# Patient Record
Sex: Female | Born: 1970 | Race: Black or African American | Hispanic: No | Marital: Married | Smoking: Never smoker
Health system: Southern US, Community
[De-identification: ages and names within clinical notes are randomized; demographics above are authoritative.]

## PROBLEM LIST (undated history)

## (undated) ENCOUNTER — Inpatient Hospital Stay (HOSPITAL_COMMUNITY): Payer: Medicaid Other

## (undated) DIAGNOSIS — F419 Anxiety disorder, unspecified: Secondary | ICD-10-CM

## (undated) HISTORY — DX: Anxiety disorder, unspecified: F41.9

---

## 1997-11-01 ENCOUNTER — Emergency Department (HOSPITAL_COMMUNITY): Admission: EM | Admit: 1997-11-01 | Discharge: 1997-11-01 | Payer: Self-pay | Admitting: Emergency Medicine

## 1997-11-19 ENCOUNTER — Ambulatory Visit (HOSPITAL_COMMUNITY): Admission: RE | Admit: 1997-11-19 | Discharge: 1997-11-19 | Payer: Self-pay | Admitting: Obstetrics & Gynecology

## 1997-12-30 ENCOUNTER — Inpatient Hospital Stay (HOSPITAL_COMMUNITY): Admission: AD | Admit: 1997-12-30 | Discharge: 1998-01-01 | Payer: Self-pay | Admitting: *Deleted

## 2002-09-03 ENCOUNTER — Ambulatory Visit (HOSPITAL_COMMUNITY): Admission: RE | Admit: 2002-09-03 | Discharge: 2002-09-03 | Payer: Self-pay | Admitting: Obstetrics & Gynecology

## 2002-09-03 ENCOUNTER — Encounter: Payer: Self-pay | Admitting: Obstetrics & Gynecology

## 2003-01-18 ENCOUNTER — Encounter (INDEPENDENT_AMBULATORY_CARE_PROVIDER_SITE_OTHER): Payer: Self-pay | Admitting: Specialist

## 2003-01-18 ENCOUNTER — Inpatient Hospital Stay (HOSPITAL_COMMUNITY): Admission: AD | Admit: 2003-01-18 | Discharge: 2003-01-20 | Payer: Self-pay | Admitting: Obstetrics & Gynecology

## 2006-08-12 ENCOUNTER — Inpatient Hospital Stay (HOSPITAL_COMMUNITY): Admission: AD | Admit: 2006-08-12 | Discharge: 2006-08-12 | Payer: Self-pay | Admitting: Obstetrics

## 2006-10-03 ENCOUNTER — Ambulatory Visit (HOSPITAL_COMMUNITY): Admission: RE | Admit: 2006-10-03 | Discharge: 2006-10-03 | Payer: Self-pay | Admitting: Obstetrics

## 2007-02-19 ENCOUNTER — Inpatient Hospital Stay (HOSPITAL_COMMUNITY): Admission: AD | Admit: 2007-02-19 | Discharge: 2007-02-22 | Payer: Self-pay | Admitting: Obstetrics and Gynecology

## 2007-11-27 ENCOUNTER — Ambulatory Visit: Payer: Self-pay | Admitting: Internal Medicine

## 2007-12-01 ENCOUNTER — Ambulatory Visit: Payer: Self-pay | Admitting: *Deleted

## 2007-12-09 ENCOUNTER — Ambulatory Visit: Payer: Self-pay | Admitting: Internal Medicine

## 2008-01-19 ENCOUNTER — Ambulatory Visit: Payer: Self-pay | Admitting: Internal Medicine

## 2008-01-19 ENCOUNTER — Encounter: Payer: Self-pay | Admitting: Family Medicine

## 2008-01-19 LAB — CONVERTED CEMR LAB
ALT: 14 units/L (ref 0–35)
AST: 16 units/L (ref 0–37)
Basophils Absolute: 0 10*3/uL (ref 0.0–0.1)
Basophils Relative: 0 % (ref 0–1)
CO2: 18 meq/L — ABNORMAL LOW (ref 19–32)
Calcium: 9.3 mg/dL (ref 8.4–10.5)
Chloride: 108 meq/L (ref 96–112)
Cholesterol: 133 mg/dL (ref 0–200)
Creatinine, Ser: 0.7 mg/dL (ref 0.40–1.20)
Lymphocytes Relative: 39 % (ref 12–46)
MCHC: 32.6 g/dL (ref 30.0–36.0)
Neutro Abs: 2.5 10*3/uL (ref 1.7–7.7)
Platelets: 280 10*3/uL (ref 150–400)
RDW: 13.7 % (ref 11.5–15.5)
Sodium: 140 meq/L (ref 135–145)
TSH: 1.056 microintl units/mL (ref 0.350–4.50)
Total Bilirubin: 0.5 mg/dL (ref 0.3–1.2)
Total CHOL/HDL Ratio: 3.6
Total Protein: 7.7 g/dL (ref 6.0–8.3)
VLDL: 18 mg/dL (ref 0–40)

## 2008-01-30 ENCOUNTER — Encounter: Payer: Self-pay | Admitting: Family Medicine

## 2008-01-30 ENCOUNTER — Ambulatory Visit: Payer: Self-pay | Admitting: Internal Medicine

## 2008-01-30 LAB — CONVERTED CEMR LAB
Chlamydia, DNA Probe: NEGATIVE
GC Probe Amp, Genital: NEGATIVE

## 2008-03-01 ENCOUNTER — Ambulatory Visit: Payer: Self-pay | Admitting: Internal Medicine

## 2008-05-24 ENCOUNTER — Ambulatory Visit: Payer: Self-pay | Admitting: Internal Medicine

## 2008-06-23 ENCOUNTER — Ambulatory Visit: Payer: Self-pay | Admitting: Internal Medicine

## 2008-08-16 ENCOUNTER — Ambulatory Visit: Payer: Self-pay | Admitting: Internal Medicine

## 2008-09-13 IMAGING — US US OB COMP +14 WK
1 series · 14 of 28 positions shown · non-contrast
Comparison: none

OBSTETRICAL ULTRASOUND:

 This ultrasound exam was performed in the [HOSPITAL] Ultrasound Department.  The OB US report was generated in the AS system, and faxed to the ordering physician.  This report is also available in [REDACTED] PACS.

[Series 1: us ob comp +14 wk · 0.24mm/px · 14 of 85 slices shown]
[im 4/85]
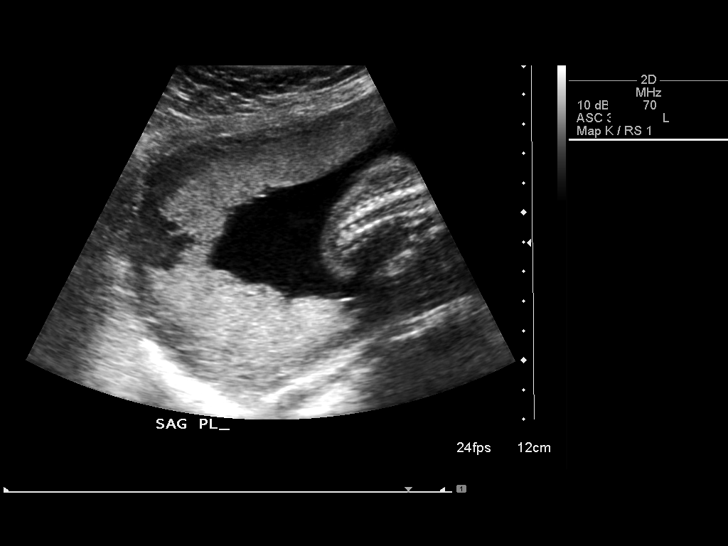
[im 10/85]
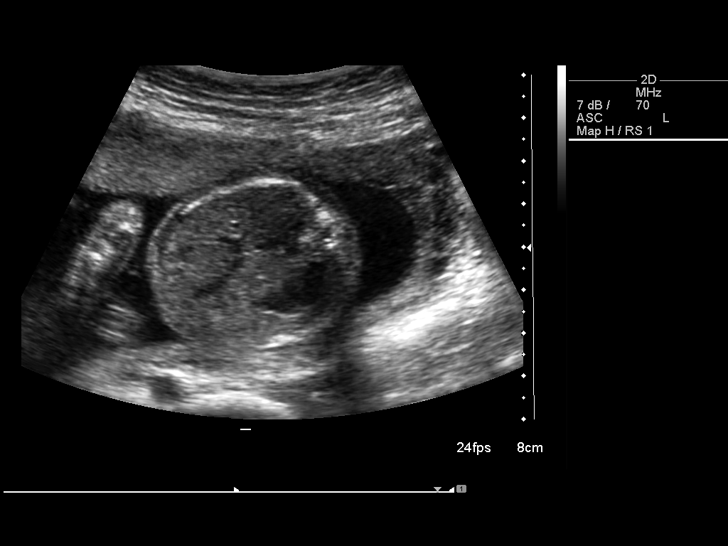
[im 16/85]
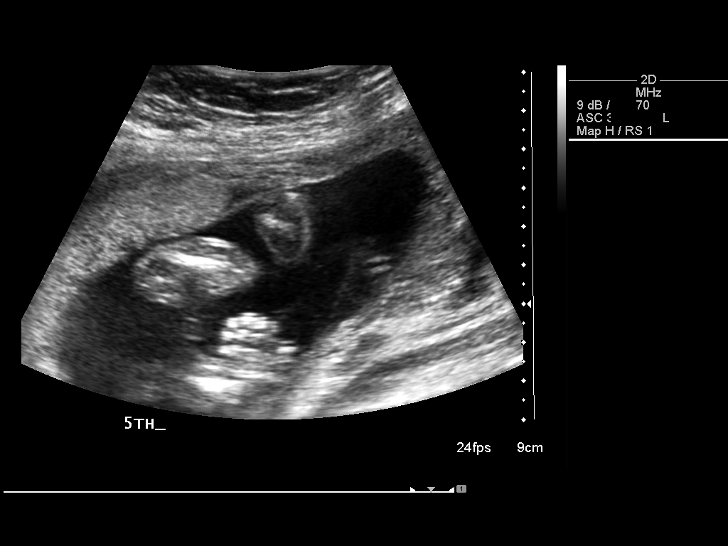
[im 22/85]
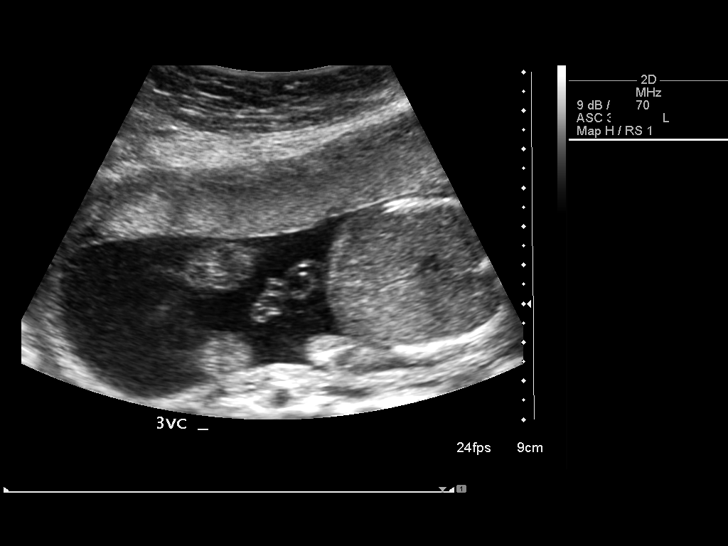
[im 29/85]
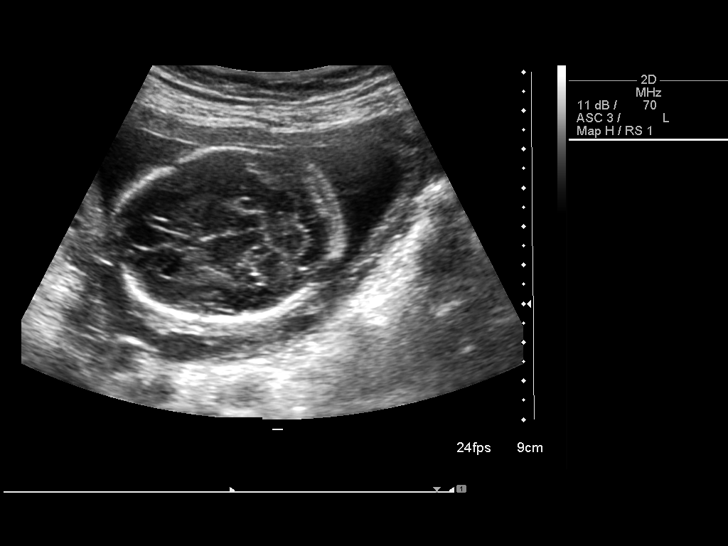
[im 35/85]
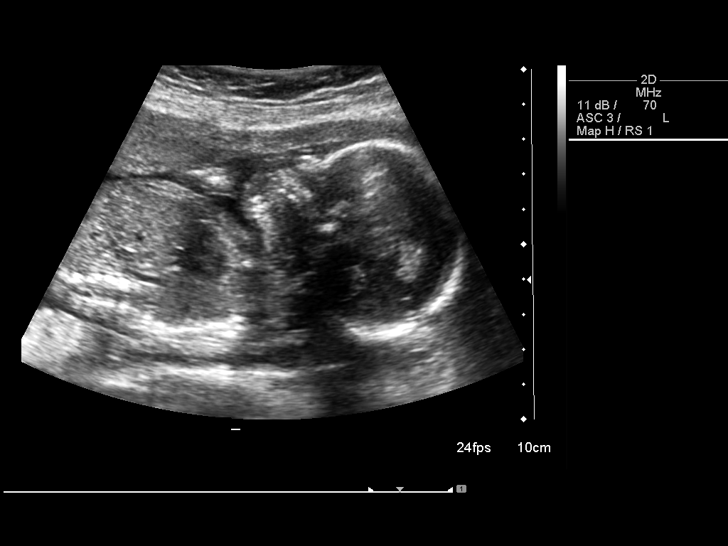
[im 41/85]
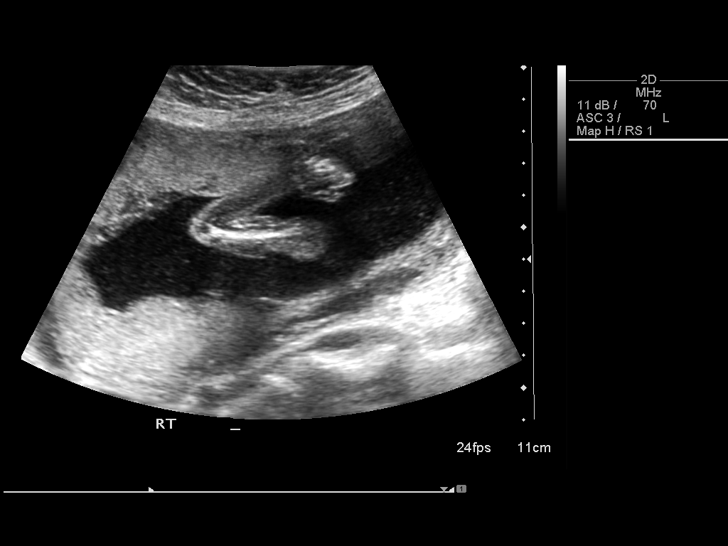
[im 47/85]
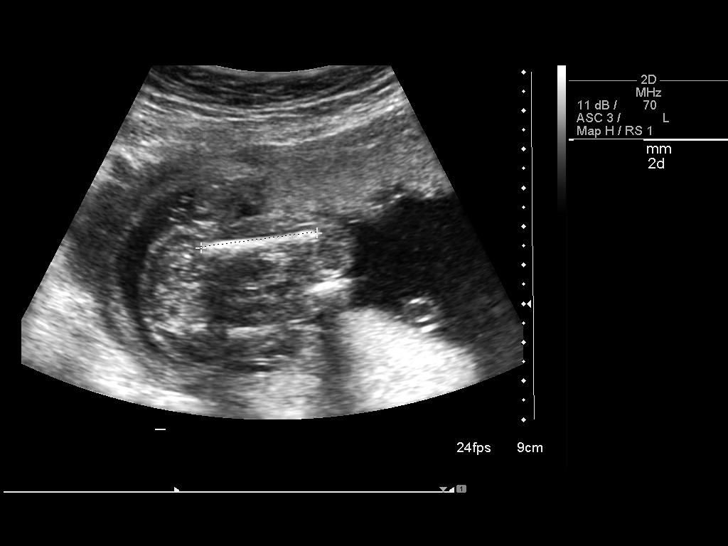
[im 53/85]
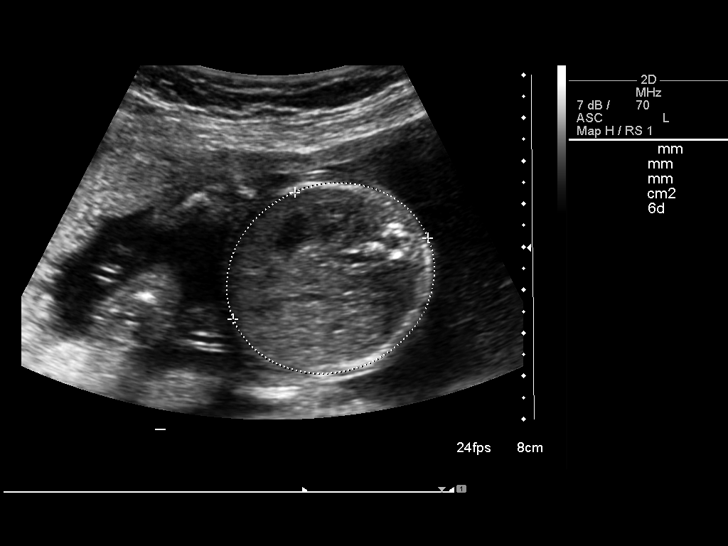
[im 60/85]
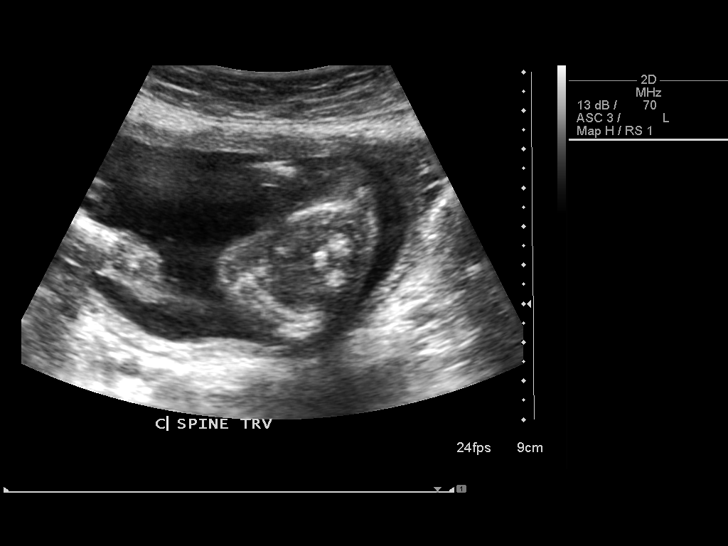
[im 66/85]
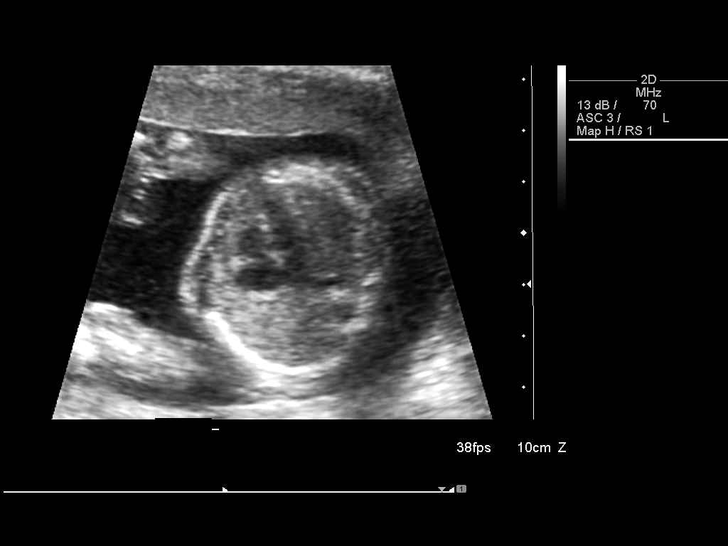
[im 72/85]
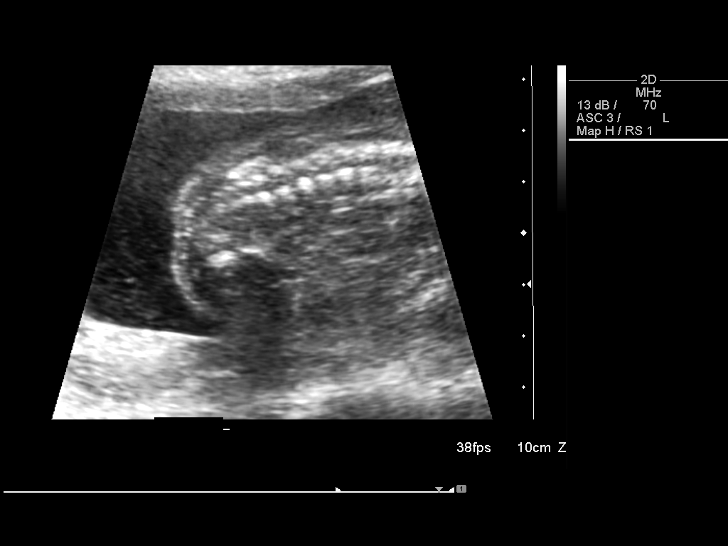
[im 78/85]
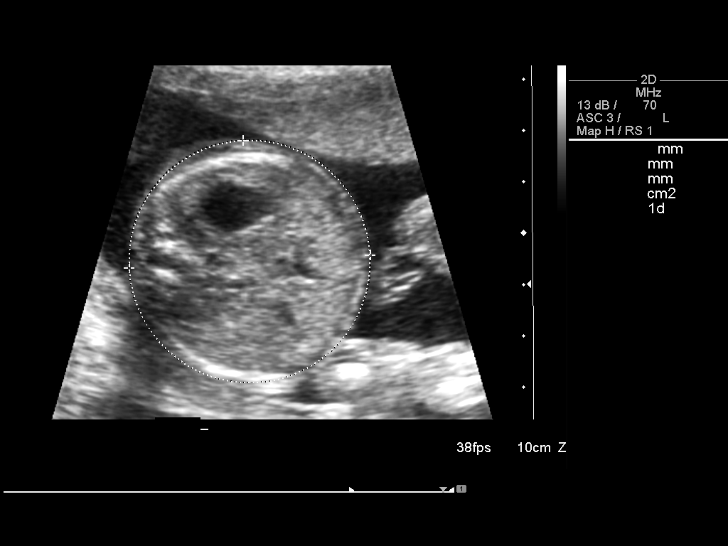
[im 85/85]
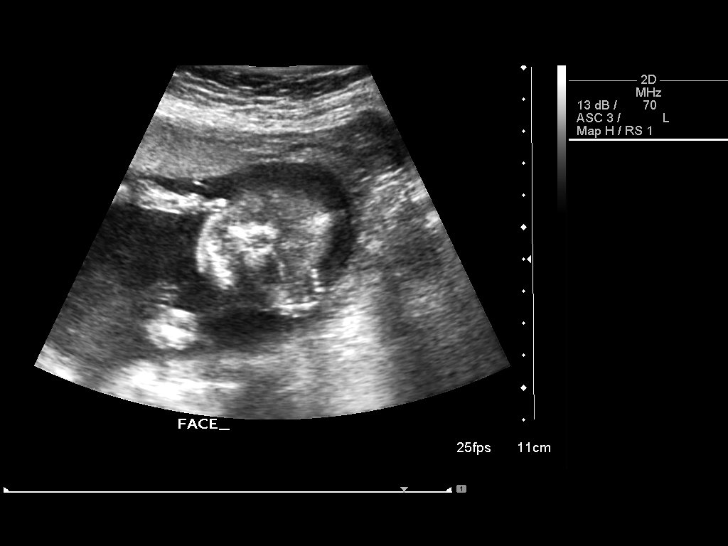

[14 of 28 positions shown; findings below may reference images not displayed]

IMPRESSION: See AS Obstetric US report.

## 2008-11-05 ENCOUNTER — Ambulatory Visit: Payer: Self-pay | Admitting: Internal Medicine

## 2009-01-27 ENCOUNTER — Ambulatory Visit: Payer: Self-pay | Admitting: Internal Medicine

## 2009-03-03 ENCOUNTER — Ambulatory Visit: Payer: Self-pay | Admitting: Family Medicine

## 2009-04-08 ENCOUNTER — Encounter: Payer: Self-pay | Admitting: Family Medicine

## 2009-04-08 ENCOUNTER — Ambulatory Visit: Payer: Self-pay | Admitting: Internal Medicine

## 2009-05-31 ENCOUNTER — Ambulatory Visit: Payer: Self-pay | Admitting: Internal Medicine

## 2010-07-13 ENCOUNTER — Encounter (INDEPENDENT_AMBULATORY_CARE_PROVIDER_SITE_OTHER): Payer: Self-pay | Admitting: Physician Assistant

## 2010-07-13 ENCOUNTER — Other Ambulatory Visit: Payer: Self-pay | Admitting: Physician Assistant

## 2010-07-13 DIAGNOSIS — Z01419 Encounter for gynecological examination (general) (routine) without abnormal findings: Secondary | ICD-10-CM

## 2010-07-13 DIAGNOSIS — Z124 Encounter for screening for malignant neoplasm of cervix: Secondary | ICD-10-CM

## 2010-08-25 NOTE — Progress Notes (Signed)
[name], [name]                ACCOUNT NO.:  [id number]  MEDICAL RECORD NO.:  [id number]           PATIENT TYPE:  A  LOCATION:  [facility]                   FACILITY:  [facility]  PHYSICIAN:  Maylon Cos, CNM    DATE OF BIRTH:  01/12/1971  DATE OF SERVICE:  07/13/2010                                 CLINIC NOTE  REASON FOR VISIT:  Annual exam and Pap smear.  HISTORY OF PRESENT ILLNESS:  The patient presents with no complaints.  ALLERGIES:  She has no known drug allergies.  MEDICATIONS:  She is not currently taking any medications.  PRIMARY CARE PROVIDER:  [facility].  Her last physical exam was in August 2011.  MENSTRUAL HISTORY:  First day of her last menstrual period was June 02, 2011.  She has regular periods.  They last from 3-5 days and are roughly 28-30 days cycles.  She has a medium flow without pain and no bleeding in between her periods.  CONTRACEPTION:  She is currently using condoms.  She is not desiring pregnancy in the future.  OBSTETRICAL HISTORY:  She is a gravida 7, para 7-0-0-7 with 1 cesarean section for transverse lie and 6 spontaneous vaginal deliveries without complications.  GYNECOLOGIC HISTORY:  Her last Pap smear was performed in 2010.  She has no history of abnormal Pap smears.  STD HISTORY:  Negative.  PERSONAL MEDICAL HISTORY:  Negative.  FAMILY HISTORY:  Positive for hypertension in her mother and diabetes in her maternal grandmother.  SURGICAL HISTORY:  Positive for cesarean section for transverse lie in 2004.  She has no history of blood transfusions.  SOCIAL HISTORY:  She lives with her husband and 6 children who range in age 51 to age 14.  She is not currently working outside the home.  She denies tobacco use, alcohol use, drug use, physical or sexual abuse. She has had one sexual partner in the last year.  REVIEW OF SYSTEMS:  Systemic review was negative x12.  PHYSICAL EXAMINATION:  VITAL SIGNS:  Stable.  Pulse is 66,  blood pressure is 114/70, her weight today is 132.9, and height is 67 inches. Exam today is GYN focused. HEENT:  Grossly normal. NECK:  No thyromegaly. BREASTS:  Symmetrical.  No dimpling or retraction of the skin.  Nipples are erect without discharge.  No masses or nontender. ABDOMEN:  Nontender.  No hepatosplenomegaly. GENITALIA:  She is Tanner 5.  Mucous membranes are pink without lesions. She has irregular rugae with moderate tone.  Cervix is parous and nonfriable.  Bimanual exam reveals a nontender nonenlarged uterus, nontender and nonenlarged adnexa. EXTREMITIES:  Equal strength x4.  Lower extremities are without edema and pedal pulses are equal.  ASSESSMENT: 1. Well-woman. 2. Undesired fertility.  PLAN: 1. Pap smear was obtained and sent per routine to Pathology. 2. The patient is currently encouraged to use spermicide along with     her condom use for contraception to increase pregnancy protection.     The patient is also encouraged to get daily multivitamin with folic     acid. 3. Health maintenance.  The patient is encouraged to exercise at least  30 minutes daily.  The patient should return annually for physical     exams given her past medical history of no abnormals in her age.     She will be due for her next Pap smear in 2-3 years given that this     was normal and should follow up with [facility] as needed     for primary care.          ______________________________ Maylon Cos, CNM    SS/MEDQ  D:  07/13/2010  T:  07/14/2010  Job:  [id number]

## 2010-10-17 NOTE — H&P (Signed)
[name], [name]            ACCOUNT NO.:  [id number]   MEDICAL RECORD NO.:  [id number]          PATIENT TYPE:  INP   LOCATION:  9174                          [facility]   PHYSICIAN:  Roseanna Rainbow, M.D.DATE OF BIRTH:  [birth year]   DATE OF ADMISSION:  02/19/2007  DATE OF DISCHARGE:                              HISTORY & PHYSICAL   CHIEF COMPLAINT:  The patient is a 40 year old para 6 with estimated  date of confinement of February 27, 2007 with an intrauterine pregnancy  at 58 and 6/7 weeks for an elective induction of labor.   HISTORY OF PRESENT ILLNESS:  Please see the above.   ALLERGIES:  NO KNOWN DRUG ALLERGIES.   MEDICATIONS:  Please see the medication reconciliation form.   OB RISK FACTORS:  1. Grand multiparity.  2. History of previous cesarean section.  3. Documented low uterine flap elliptical.  4. Urinary tract infection.   PRENATAL SCREENING:  Quad screen negative.  Platelets 191,000,  hemoglobin 12.4, hematocrit 35.8, urine culture and sensitivity on September 05, 2006, enterococcus.  GBS positive on January 21, 2007.  Three hour GTT  normal.  HIV nonreactive.  Sickle-cell negative.  Blood Type is B  positive, antibody screen negative.  RPR nonreactive.  GC probe  negative.  PAP smear negative.  Varicella immune.   PAST GYNECOLOGICAL HISTORY:  Noncontributory.   PAST MEDICAL HISTORY:  No significant history of medical disease.   PAST SURGICAL HISTORY:  Please seen the above.   SOCIAL HISTORY:  She is a Psychiatric nurse.  She is married, living with  her spouse.  She does not give any significant history of alcohol usage.  She has no significant smoking history.  She denies illicit drug use.   FAMILY HISTORY:  Hypertension   PAST OBSTETRICAL HISTORY:  In 1992, she was delivered of a liveborn  female, 7 pounds, vaginal delivery, no complications.  In 1995 she was delivered of a liveborn female, 7 pounds, full term,  vaginal delivery, no  complications.  In 1997, she was delivered of a liveborn female, 7 pounds, vaginal  delivery, no complications.  In 1998, she was delivered of a liveborn female, 7 pounds, vaginal  delivery, no complications.  In 1999, she was delivered of a liveborn female, 7 pounds 14 ounces,  vaginal delivery, no complications.  In 2004, she was delivered of a liveborn female, full term via cesarean  delivery secondary to arrest of descent.   PHYSICAL EXAMINATION:  VITAL SIGNS:  Stable, afebrile. Fetal heart  tracing reassuring.  TOCODYNAMOMETRY: Rare uterine contractions.  STERILE VAGINAL EXAM:  The cervix is 2 cm dilated, 60% effaced with a  vertex at a minus 2 station.   ASSESSMENT:  1. Grand multipara at term.  2. History of a previous cesarean delivery.  Desires vaginal birth      after cesarean section.  3. Fetal heart tracing consistent with fetal well-being.  Borderline      favorable Bishop score.  4. Group B Streptococcus positive.   PLAN:  1. Admission.  2. Low dose pitocin per protocol.  3. Penicillin group B  Streptococcus prophylaxis per protocol.      Roseanna Rainbow, M.D.  Electronically Signed     LAJ/MEDQ  D:  02/19/2007  T:  02/19/2007  Job:  [id number]

## 2010-10-20 NOTE — Op Note (Signed)
[name], [name]                        ACCOUNT NO.:  [id number]   MEDICAL RECORD NO.:  [id number]                   PATIENT TYPE:  INP   LOCATION:  9141                                 [facility]   PHYSICIAN:  Roseanna Rainbow, M.D.         DATE OF BIRTH:  1970/12/26   DATE OF PROCEDURE:  01/18/2003  DATE OF DISCHARGE:                                 OPERATIVE REPORT   PREOPERATIVE DIAGNOSIS:  Suspicious fetal heart tracing with repetitive  prolonged severe decelerations, remote from delivery.   POSTOPERATIVE DIAGNOSIS:  Suspicious fetal heart tracing with repetitive  prolonged severe decelerations, remote from delivery.   PROCEDURE:  Primary low uterine flap elliptical cesarean delivery via  Pfannenstiel skin incision.   SURGEON:  Roseanna Rainbow, M.D.   ASSISTANT:  Naima A. Normand Sloop, M.D.   ANESTHESIA:  Spinal anesthesia.   ESTIMATED BLOOD LOSS:  800 mL.   COMPLICATIONS:  None.   DESCRIPTION OF PROCEDURE:  The patient was taken to the operating room.  A  spinal anesthetic was administered without difficulty.  She was then placed  in the dorsal supine position and prepped and draped quickly.  A  Pfannenstiel skin incision was then made with the scalpel and carried down  to the underlying fascia.  The fascia was incised with the scalpel.  The  rectus muscles were bluntly separated in the midline.  The parietal  peritoneum was then entered bluntly.  The bladder blade was placed.  The  vesicouterine peritoneum was tented up and entered sharply with Metzenbaum  scissors.  The bladder flap was created digitally.  The bladder blade was  then replaced.  The lower uterine segment was incised in transverse fashion  with the scalpel.  This incision was then extended bluntly.  A live born  female was then delivered vertex presentation. The infant was suctioned with  the bulb suction.  The cord was clamped and cut.  Cord gases were sent.  The  placenta was then  removed.  The uterus was cleared of amniotic fluid, clots  and debris with a moistened laparotomy sponge.  The uterine incision was  then reapproximated in a running interlocking fashion using 0 Monocryl.  A  second layer of the same suture was used to obtain excellent hemostasis.  The paracolic gutters were then irrigated.  The parietal peritoneum was  reapproximated using 2-0 Vicryl.  The fascia was reapproximated in a running  fashion with 0 Vicryl.  The skin was reapproximated in a subcuticular  fashion using 3-0 Monocryl.  There was 1 g of cefazolin given at cord clamp.  At the close of the procedure, the instrument and pack counts were said to  be correct x2.  The patient was taken to the PACU awake and in stable  condition.  Roseanna Rainbow, M.D.   Judee Clara  D:  01/18/2003  T:  01/18/2003  Job:  [id number]

## 2010-10-20 NOTE — Discharge Summary (Signed)
[name], [name]                        ACCOUNT NO.:  [id number]   MEDICAL RECORD NO.:  [id number]                   PATIENT TYPE:  INP   LOCATION:  9141                                 [facility]   PHYSICIAN:  Roseanna Rainbow, M.D.         DATE OF BIRTH:  [birth year]   DATE OF ADMISSION:  01/18/2003  DATE OF DISCHARGE:  01/20/2003                                 DISCHARGE SUMMARY   CHIEF COMPLAINT:  The patient is a 40 year old gravida 6, para 5, with an  estimated date of confinement January 21, 2003, with an intrauterine  pregnancy at 39 weeks, complaining of contractions.   HISTORY OF PRESENT ILLNESS:  As above.   ANTEPARTUM PROBLEMS OR RISKS:  1. Grand multiparity.  2. GBS positive.   LABORATORY WORK:  Hemoglobin and hematocrit 11.8 and 34.8.  Blood type B+.  Antibody screen negative.  Sickle cell trait negative.  RPR nonreactive.  Hepatitis B surface antigen negative.  HIV nonreactive.  Pap smear within  normal limits.  GC and Chlamydia negative.   PAST OBGYN HISTORY:  She is status post five NSVDs.   PAST MEDICAL HISTORY:  She denies.   PAST SURGICAL HISTORY:  She denies.   FAMILY HISTORY:  She denies.   SOCIAL HISTORY:  Married.  No tobacco, ethanol, or substance abuse.   ALLERGIES:  No known drug allergies.   MEDICATIONS:  Prenatal vitamins.   PHYSICAL EXAMINATION:  VITAL SIGNS:  Blood pressure 123/69, pulse 73,  respirations 18, temperature 98.7.  Fetal heart rate 130s with mild variable  decelerations.  Uterine contractions every 5 minutes and comfortable.  VAGINAL:  Sterile vaginal exam per the registered nurse, 5 centimeters  dilated, 70% effaced with the vertex at a -2 station.   ASSESSMENTPLAN:  1. Grand multipara at term with late latent versus early active labor.  2. GBS positive.  3. Fetal heart tracing reassuring.  4. Admit.  5. Expectant management.  6. Penicillin GBS prophylaxis.   HOSPITAL COURSE:  The patient was admitted.   The fetal heart tracing  developed repetitive prolonged decelerations nadir in the 60s.  The  patient's cervical exam was without change.  She was brought to the  operating room for urgent cesarean delivery.  Please see the dictated  operative summary for further details.  The remainder of her hospital course  was uneventful.  She was discharged to home on postoperative day number two,  tolerating a regular diet.   DISCHARGE DIAGNOSES:  1. Term intrauterine pregnancy.  2. Suspicious fetal heart tracing with prolonged repetitive severe     decelerations.   PROCEDURE:  Cesarean delivery.   CONDITION ON DISCHARGE:  Good.   DIET:  Regular.   ACTIVITY:  As per the instruction booklet.   MEDICATIONS:  1. Percocet.  2. Ibuprofen.  3. Prenatal vitamins.  4. Ferrous sulfate.   DISPOSITION:  The patient was to follow up in the office in one  week.                                               Roseanna Rainbow, M.D.    LAJ/MEDQ  D:  02/24/2003  T:  02/24/2003  Job:  [id number]

## 2011-03-15 LAB — CBC
HCT: 34.3 — ABNORMAL LOW
HCT: 37.4
MCHC: 34.6
MCHC: 35.4
MCV: 86.7
Platelets: 168
RBC: 4.31
RDW: 14

## 2011-03-15 LAB — RPR: RPR Ser Ql: NONREACTIVE

## 2012-06-04 NOTE — L&D Delivery Note (Signed)
Delivery Note At 12:58 PM a viable female was delivered via  (Presentation: ;  ).  APGAR: , ; weight .   Placenta status: , .  Cord:  with the following complications: .  Cord pH: not done  Anesthesia:   Episiotomy:  Lacerations:  Suture Repair: 2.0 Est. Blood Loss (mL):   Mom to postpartum.  Baby to Couplet care / Skin to Skin.  MARSHALL,BERNARD A 05/08/2013, 1:07 PM

## 2012-09-23 ENCOUNTER — Encounter: Payer: Self-pay | Admitting: Obstetrics & Gynecology

## 2012-09-23 ENCOUNTER — Encounter: Payer: Self-pay | Admitting: *Deleted

## 2012-09-23 ENCOUNTER — Other Ambulatory Visit (INDEPENDENT_AMBULATORY_CARE_PROVIDER_SITE_OTHER): Payer: Medicaid Other

## 2012-09-23 ENCOUNTER — Other Ambulatory Visit: Payer: Self-pay | Admitting: Obstetrics & Gynecology

## 2012-09-23 DIAGNOSIS — Z3200 Encounter for pregnancy test, result unknown: Secondary | ICD-10-CM

## 2012-09-23 DIAGNOSIS — Z3201 Encounter for pregnancy test, result positive: Secondary | ICD-10-CM

## 2012-09-23 NOTE — Progress Notes (Signed)
Pt informed of + urine pregnancy test. She stated that she wants "to have a miscarriage" and "I am not ready right now".  I informed pt that we do not perform abortions at this hospital. I advised her to contact [facility] or consult the phone book, internet, etc for places that perform abortions in [place]. Pt voiced understanding and was given letter of confirmation of positive pregnancy test.

## 2012-10-17 ENCOUNTER — Encounter (INDEPENDENT_AMBULATORY_CARE_PROVIDER_SITE_OTHER): Payer: Medicaid Other | Admitting: Obstetrics & Gynecology

## 2012-10-17 ENCOUNTER — Encounter: Payer: Self-pay | Admitting: Obstetrics & Gynecology

## 2012-10-17 ENCOUNTER — Ambulatory Visit (INDEPENDENT_AMBULATORY_CARE_PROVIDER_SITE_OTHER): Payer: Medicaid Other | Admitting: Obstetrics & Gynecology

## 2012-10-17 VITALS — BP 96/63 | Temp 98.2°F | Wt 136.2 lb

## 2012-10-17 DIAGNOSIS — O09521 Supervision of elderly multigravida, first trimester: Secondary | ICD-10-CM

## 2012-10-17 DIAGNOSIS — O09529 Supervision of elderly multigravida, unspecified trimester: Secondary | ICD-10-CM | POA: Insufficient documentation

## 2012-10-17 DIAGNOSIS — Z3480 Encounter for supervision of other normal pregnancy, unspecified trimester: Secondary | ICD-10-CM

## 2012-10-17 DIAGNOSIS — Z3481 Encounter for supervision of other normal pregnancy, first trimester: Secondary | ICD-10-CM

## 2012-10-17 LAB — POCT URINALYSIS DIPSTICK
Bilirubin, UA: NEGATIVE
Blood, UA: 250
Glucose, UA: NEGATIVE
Leukocytes, UA: NEGATIVE
Nitrite, UA: NEGATIVE
Urobilinogen, UA: NEGATIVE
pH, UA: 7

## 2012-10-17 LAB — POCT URINE PREGNANCY: Preg Test, Ur: POSITIVE

## 2012-10-17 LAB — HIV ANTIBODY (ROUTINE TESTING W REFLEX): HIV: NONREACTIVE

## 2012-10-17 MED ORDER — TERCONAZOLE 0.4 % VA CREA: 1.0000 | TOPICAL_CREAM | Freq: Every day | VAGINAL | Status: DC

## 2012-10-17 MED ORDER — NEXA PLUS 29-1.25-350 MG PO CAPS: 1.0000 | ORAL_CAPSULE | Freq: Every morning | ORAL | Status: DC

## 2012-10-17 NOTE — Progress Notes (Signed)
Pulse- 69  . Subjective:    [name] is being seen today for her first obstetrical visit.  This is not a planned pregnancy. She is at [redacted]w[redacted]d gestation. Her obstetrical history is significant for advanced maternal age. Relationship with FOB: spouse, living together. Patient does intend to breast feed. Pregnancy history fully reviewed.  Menstrual History: OB History   Grav Para Term Preterm Abortions TAB SAB Ect Mult Living   8 7 7       7       Menarche age: 35 Patient's last menstrual period was 08/11/2012.    The following portions of the patient's history were reviewed and updated as appropriate: allergies, current medications, past family history, past medical history, past social history, past surgical history and problem list.  Review of Systems Pertinent items are noted in HPI.    Objective:   General Appearance:    Alert, cooperative, no distress, appears stated age  Head:    Normocephalic, without obvious abnormality, atraumatic  Eyes:    PERRL, conjunctiva/corneas clear, EOM's intact, fundi    benign, both eyes  Ears:    Normal TM's and external ear canals, both ears  Nose:   Nares normal, septum midline, mucosa normal, no drainage    or sinus tenderness  Throat:   Lips, mucosa, and tongue normal; teeth and gums normal  Neck:   Supple, symmetrical, trachea midline, no adenopathy;    thyroid:  no enlargement/tenderness/nodules; no carotid   bruit or JVD  Back:     Symmetric, no curvature, ROM normal, no CVA tenderness  Lungs:     Clear to auscultation bilaterally, respirations unlabored  Chest Wall:    No tenderness or deformity   Heart:    Regular rate and rhythm, S1 and S2 normal, no murmur, rub   or gallop  Breast Exam:    No tenderness, masses, or nipple abnormality  Abdomen:     Soft, non-tender, bowel sounds active all four quadrants,    no masses, no organomegaly  Genitalia:    Erythema, white discharge   Extremities:   Extremities normal, atraumatic, no  cyanosis or edema  Pulses:   2+ and symmetric all extremities  Skin:   Skin color, texture, turgor normal, no rashes or lesions  Lymph nodes:   Cervical, supraclavicular, and axillary nodes normal  Neurologic:   CNII-XII intact, normal strength, sensation and reflexes    throughout     Assessment:    Pregnancy at [redacted]w[redacted]d weeks    Plan:    Initial labs drawn. Prenatal vitamins. Problem list reviewed and updated. Amniocentesis discussed: declined. Referral for genetics counseling for possible NIPT Follow up in 4 weeks. 50% of 20 min visit spent on counseling and coordination of care.

## 2012-10-17 NOTE — Progress Notes (Signed)
CRL by me 11+ w/cardiac activity present.

## 2012-10-17 NOTE — Patient Instructions (Signed)

## 2012-10-17 NOTE — Patient Instructions (Signed)
Amniocentesis Amniocentesis (amnio) is the removal of a small amount of fluid that surrounds the baby in the amniotic sac. Once the fluid is removed, it may be examined to find answers to a number of serious questions. An amnio is often done early in pregnancy (between 15 and 17 weeks) to determine if there is a complication with the baby, or it is done later in pregnancy (between 6633 and 37 weeks) to see if the baby's lungs are mature. Amnios that are done later in pregnancy are often done to help weigh the risks and benefits of a needed early delivery. Amnios done early in the second trimester are commonly referred to as genetic amnios, as they are typically done to check for a potential life-altering genetic abnormality. Rarely, an amnio is done to evaluate the amniotic fluid for concerns of infection. Amniocentesis may be done for other reasons, including:   If the mother is 40 years old or older. This is because of the increased risk of chromosome abnormalities, such as Down's syndrome or various chromosomal trisomies.  To determine any genetic problems.  To look for signs of infection in the uterus.  To determine if the baby's lungs are mature enough for the baby to survive outside of the uterus. This information is important in pregnancies when the baby must be delivered early. LET YOUR CAREGIVER KNOW ABOUT:  Any complications you have had with the pregnancy, such as bleeding or contractions.  Allergies.  Medicines taken, including vitamins, herbs, eyedrops, over-the-counter medicines, and creams.  Use of steroids (by mouth or creams).  Previous problems with numbing medicines.  History of bleeding problems or blood clots.  Previous surgery.  Other health problems. RISKS AND COMPLICATIONS  Complications can include:  Vaginal bleeding.  Transmission of an infection from mother to baby, such as hepatitis C or other viruses.  Leaking of amniotic fluid.  Premature  labor.  Fetal injury.  Injury to the placenta.  Miscarriage (rare). This procedure is done only if your caregiver feels the information obtained from the procedure justifies the risk. Amnios should not be performed before 15 weeks of pregnancy unless it is absolutely necessary. BEFORE THE PROCEDURE   Ask your caregiver any questions you may have.  Eat as usual.  Drink enough fluids to keep your urine clear or pale yellow.  You may want to arrange for someone to drive you home after the procedure. PROCEDURE  A careful ultrasound is done to evaluate the baby for its position and for where the best pockets of fluid lie. The mother's abdomen is prepped with a solution to prevent infections. Often, a sterile ultrasound probe is used to watch the location of the amniocentesis needle being used. A small spot of the skin may be injected with a numbing medicine. In that location, a longer needle is introduced through the skin and down to the level of the baby. Amniotic fluid is removed into a syringe and sent to the lab for evaluation. AFTER THE PROCEDURE   Ask your caregiver if you need a RhoGam shot.  You will rest for 1 to 2 hours for observation.  Your baby will be placed on a monitor for 1 to 2 hours to see if there are any problems.  You may develop cramping and a small amount of vaginal bleeding right after the amnio.  Ask when your test results will be ready. Make sure you get your test results. Document Released: 05/18/2000 Document Revised: 08/13/2011 Document Reviewed: 03/26/2011 Exit[employer] Patient Information  [address], [place].

## 2012-10-18 LAB — WET PREP BY MOLECULAR PROBE
Gardnerella vaginalis: NEGATIVE
Trichomonas vaginosis: NEGATIVE

## 2012-10-18 LAB — VITAMIN D 25 HYDROXY (VIT D DEFICIENCY, FRACTURES): Vit D, 25-Hydroxy: <10 ng/mL — ABNORMAL LOW (ref 30–89)

## 2012-10-20 ENCOUNTER — Ambulatory Visit: Payer: Medicaid Other | Admitting: Obstetrics & Gynecology

## 2012-10-20 LAB — OBSTETRIC PANEL
Antibody Screen: NEGATIVE
Basophils Absolute: 0 10*3/uL (ref 0.0–0.1)
Basophils Relative: 0 % (ref 0–1)
Eosinophils Absolute: 0.4 10*3/uL (ref 0.0–0.7)
Eosinophils Relative: 6 % — ABNORMAL HIGH (ref 0–5)
HCT: 38.3 % (ref 36.0–46.0)
Lymphocytes Relative: 25 % (ref 12–46)
MCH: 28.3 pg (ref 26.0–34.0)
MCHC: 34.5 g/dL (ref 30.0–36.0)
MCV: 82.2 fL (ref 78.0–100.0)
Monocytes Absolute: 0.6 10*3/uL (ref 0.1–1.0)
Platelets: 257 10*3/uL (ref 150–400)
RDW: 14.9 % (ref 11.5–15.5)
WBC: 6.7 10*3/uL (ref 4.0–10.5)

## 2012-10-20 LAB — PAP IG, CT-NG NAA, HPV HIGH-RISK: GC Probe Amp: NEGATIVE

## 2012-10-20 LAB — CULTURE, OB URINE

## 2012-10-21 ENCOUNTER — Encounter: Payer: Self-pay | Admitting: Obstetrics & Gynecology

## 2012-10-21 DIAGNOSIS — R8271 Bacteriuria: Secondary | ICD-10-CM | POA: Insufficient documentation

## 2012-10-21 DIAGNOSIS — O9989 Other specified diseases and conditions complicating pregnancy, childbirth and the puerperium: Secondary | ICD-10-CM

## 2012-10-21 LAB — HEMOGLOBINOPATHY EVALUATION
Hgb A2 Quant: 2.7 % (ref 2.2–3.2)
Hgb A: 97.3 % (ref 96.8–97.8)
Hgb F Quant: 0 % (ref 0.0–2.0)
Hgb S Quant: 0 %

## 2012-10-22 ENCOUNTER — Encounter: Payer: Self-pay | Admitting: Obstetrics & Gynecology

## 2012-10-22 DIAGNOSIS — E559 Vitamin D deficiency, unspecified: Secondary | ICD-10-CM | POA: Insufficient documentation

## 2012-10-28 ENCOUNTER — Other Ambulatory Visit: Payer: Self-pay | Admitting: Obstetrics & Gynecology

## 2012-10-28 DIAGNOSIS — O9989 Other specified diseases and conditions complicating pregnancy, childbirth and the puerperium: Secondary | ICD-10-CM

## 2012-10-29 ENCOUNTER — Encounter: Payer: Self-pay | Admitting: Obstetrics

## 2012-10-29 ENCOUNTER — Encounter: Payer: Self-pay | Admitting: *Deleted

## 2012-10-29 ENCOUNTER — Ambulatory Visit (INDEPENDENT_AMBULATORY_CARE_PROVIDER_SITE_OTHER): Payer: Medicaid Other

## 2012-10-29 DIAGNOSIS — Z3480 Encounter for supervision of other normal pregnancy, unspecified trimester: Secondary | ICD-10-CM

## 2012-10-29 DIAGNOSIS — O9989 Other specified diseases and conditions complicating pregnancy, childbirth and the puerperium: Secondary | ICD-10-CM

## 2012-10-29 LAB — US OB COMP LESS 14 WKS

## 2012-10-29 LAB — US OB DETAIL + 14 WK

## 2012-10-30 ENCOUNTER — Encounter: Payer: Self-pay | Admitting: Obstetrics & Gynecology

## 2012-11-04 ENCOUNTER — Encounter: Payer: Self-pay | Admitting: Obstetrics & Gynecology

## 2012-11-05 ENCOUNTER — Ambulatory Visit (INDEPENDENT_AMBULATORY_CARE_PROVIDER_SITE_OTHER): Payer: Medicaid Other | Admitting: Obstetrics & Gynecology

## 2012-11-05 ENCOUNTER — Encounter: Payer: Self-pay | Admitting: Obstetrics & Gynecology

## 2012-11-05 VITALS — BP 111/74 | Temp 98.2°F | Ht 64.0 in | Wt 138.8 lb

## 2012-11-05 DIAGNOSIS — H1013 Acute atopic conjunctivitis, bilateral: Secondary | ICD-10-CM

## 2012-11-05 DIAGNOSIS — Z3400 Encounter for supervision of normal first pregnancy, unspecified trimester: Secondary | ICD-10-CM

## 2012-11-05 DIAGNOSIS — Z3402 Encounter for supervision of normal first pregnancy, second trimester: Secondary | ICD-10-CM

## 2012-11-05 DIAGNOSIS — H1010 Acute atopic conjunctivitis, unspecified eye: Secondary | ICD-10-CM

## 2012-11-05 DIAGNOSIS — J309 Allergic rhinitis, unspecified: Secondary | ICD-10-CM | POA: Insufficient documentation

## 2012-11-05 LAB — POCT URINALYSIS DIPSTICK
Bilirubin, UA: NEGATIVE
Glucose, UA: NEGATIVE
Ketones, UA: NEGATIVE
Spec Grav, UA: 1.02
pH, UA: 6.5

## 2012-11-05 MED ORDER — OLOPATADINE HCL 0.1 % OP SOLN: 1.0000 [drp] | Freq: Two times a day (BID) | OPHTHALMIC | Status: DC

## 2012-11-05 NOTE — Progress Notes (Signed)
Referral for genetics counseling--considering possible NIPT.  Seasonal allergies--conjunctivitis.

## 2012-11-05 NOTE — Patient Instructions (Addendum)

## 2012-11-05 NOTE — Progress Notes (Signed)
Pulse: 69

## 2012-11-11 ENCOUNTER — Institutional Professional Consult (permissible substitution): Payer: Medicaid Other

## 2012-11-27 ENCOUNTER — Encounter: Payer: Self-pay | Admitting: Obstetrics & Gynecology

## 2012-11-27 ENCOUNTER — Other Ambulatory Visit: Payer: Self-pay | Admitting: *Deleted

## 2012-11-27 DIAGNOSIS — O0990 Supervision of high risk pregnancy, unspecified, unspecified trimester: Secondary | ICD-10-CM

## 2012-12-03 ENCOUNTER — Ambulatory Visit (INDEPENDENT_AMBULATORY_CARE_PROVIDER_SITE_OTHER): Payer: Medicaid Other

## 2012-12-03 ENCOUNTER — Encounter: Payer: Self-pay | Admitting: Obstetrics & Gynecology

## 2012-12-03 ENCOUNTER — Ambulatory Visit (INDEPENDENT_AMBULATORY_CARE_PROVIDER_SITE_OTHER): Payer: Medicaid Other | Admitting: Obstetrics & Gynecology

## 2012-12-03 VITALS — BP 102/65 | Temp 97.9°F | Wt 140.0 lb

## 2012-12-03 DIAGNOSIS — Z3482 Encounter for supervision of other normal pregnancy, second trimester: Secondary | ICD-10-CM

## 2012-12-03 DIAGNOSIS — Z3480 Encounter for supervision of other normal pregnancy, unspecified trimester: Secondary | ICD-10-CM

## 2012-12-03 DIAGNOSIS — O0990 Supervision of high risk pregnancy, unspecified, unspecified trimester: Secondary | ICD-10-CM

## 2012-12-03 LAB — POCT URINALYSIS DIPSTICK
Bilirubin, UA: NEGATIVE
Glucose, UA: NEGATIVE
Ketones, UA: NEGATIVE
Spec Grav, UA: 1.025
Urobilinogen, UA: NEGATIVE

## 2012-12-03 NOTE — Progress Notes (Signed)
Doing well 

## 2012-12-03 NOTE — Patient Instructions (Addendum)
Pregnancy - Second Trimester The second trimester is the period between 13 to 27 weeks of your pregnancy. It is important to follow your doctor's instructions. HOME CARE   Do not smoke.  Do not drink alcohol or use drugs.  Only take medicine as told by your doctor.  Take prenatal vitamins as told. The vitamin should contain 1 milligram of folic acid.  Exercise.  Eat healthy foods. Eat regular, well-balanced meals.  You can have sex (intercourse) if there are no other problems with the pregnancy.  Do not use hot tubs, steam rooms, or saunas.  Wear a seat belt while driving.  Avoid raw meat, uncooked cheese, and litter boxes and soil used by cats.  Visit your dentist. Cleanings are okay. GET HELP RIGHT AWAY IF:   You have a temperature by mouth above 102 F (38.9 C), not controlled by medicine.  Fluid is coming from your vagina.  Blood is coming from your vagina. Light spotting is common, especially after sex (intercourse).  You have a bad smelling fluid (discharge) coming from the vagina. The fluid changes from clear to white.  You still feel sick to your stomach (nauseous).  You throw up (vomit) blood.  You lose or gain more than 2 pounds (0.9 kilograms) of weight in a week, or as suggested by your doctor.  Your face, hands, feet, or legs get puffy (swell).  You get exposed to German measles and have never had them.  You get exposed to fifth disease or chickenpox.  You have belly (abdominal) pain.  You have a bad headache that will not go away.  You have watery poop (diarrhea), pain when you pee (urinate), or have shortness of breath.  You start to have problems seeing (blurry or double vision).  You fall, are in a car accident, or have any kind of trauma.  There is mental or physical violence at home.  You have any concerns or worries during your pregnancy. MAKE SURE YOU:   Understand these instructions.  Will watch your condition.  Will get help  right away if you are not doing well or get worse. Document Released: 08/15/2009 Document Revised: 08/13/2011 Document Reviewed: 08/15/2009 Exit[employer] Patient Information 2014 [employer].  

## 2012-12-03 NOTE — Progress Notes (Signed)
Pulse-68  No complaints.

## 2012-12-08 ENCOUNTER — Encounter: Payer: Self-pay | Admitting: Obstetrics

## 2012-12-08 LAB — US OB DETAIL + 14 WK

## 2012-12-11 ENCOUNTER — Encounter: Payer: Self-pay | Admitting: Obstetrics & Gynecology

## 2012-12-25 ENCOUNTER — Ambulatory Visit (INDEPENDENT_AMBULATORY_CARE_PROVIDER_SITE_OTHER): Payer: Medicaid Other | Admitting: Obstetrics & Gynecology

## 2012-12-25 VITALS — BP 98/62 | Temp 98.1°F | Wt 138.8 lb

## 2012-12-25 DIAGNOSIS — Z3480 Encounter for supervision of other normal pregnancy, unspecified trimester: Secondary | ICD-10-CM

## 2012-12-25 DIAGNOSIS — Z3482 Encounter for supervision of other normal pregnancy, second trimester: Secondary | ICD-10-CM

## 2012-12-25 LAB — POCT URINALYSIS DIPSTICK
Blood, UA: NEGATIVE
Glucose, UA: NEGATIVE
Spec Grav, UA: 1.015
Urobilinogen, UA: NEGATIVE
pH, UA: 7

## 2012-12-25 NOTE — Progress Notes (Signed)
Pulse: 67

## 2012-12-25 NOTE — Addendum Note (Signed)
Addended by: Antionette Char on: 12/25/2012 03:29 PM   Modules accepted: Orders

## 2012-12-25 NOTE — Patient Instructions (Signed)

## 2012-12-25 NOTE — Progress Notes (Signed)
Pt is doing well.

## 2012-12-28 LAB — CULTURE, OB URINE

## 2012-12-30 ENCOUNTER — Encounter: Payer: Self-pay | Admitting: Obstetrics & Gynecology

## 2012-12-30 DIAGNOSIS — Z22330 Carrier of Group B streptococcus: Secondary | ICD-10-CM | POA: Insufficient documentation

## 2012-12-31 ENCOUNTER — Encounter: Payer: Medicaid Other | Admitting: Obstetrics & Gynecology

## 2012-12-31 ENCOUNTER — Other Ambulatory Visit: Payer: Self-pay | Admitting: Obstetrics & Gynecology

## 2012-12-31 DIAGNOSIS — Z1389 Encounter for screening for other disorder: Secondary | ICD-10-CM

## 2013-01-07 ENCOUNTER — Telehealth: Payer: Self-pay | Admitting: *Deleted

## 2013-01-07 MED ORDER — SULFAMETHOXAZOLE-TMP DS 800-160 MG PO TABS: 1.0000 | ORAL_TABLET | Freq: Two times a day (BID) | ORAL | Status: DC

## 2013-01-07 NOTE — Telephone Encounter (Signed)
Message copied by Glendell Docker on Wed Jan 07, 2013  5:46 PM ------      Message from: Antionette Char      Created: Tue Dec 30, 2012  7:47 AM         Call in rx for Bactrim DS 1 tab po bid x 7d      ----- Message -----         From: [facility]         Sent: 12/28/2012   2:51 PM           To: Antionette Char, MD                   ------

## 2013-01-07 NOTE — Telephone Encounter (Signed)
Call placed to patient at [phone], no answer. A voice message was left for patient to return call regarding lab results. Rx for Bactrim DS 1 tablet po bid x 7 sent to patient pharmacy on file.

## 2013-01-13 ENCOUNTER — Ambulatory Visit (INDEPENDENT_AMBULATORY_CARE_PROVIDER_SITE_OTHER): Payer: Medicaid Other

## 2013-01-13 ENCOUNTER — Other Ambulatory Visit: Payer: Self-pay | Admitting: Obstetrics & Gynecology

## 2013-01-13 ENCOUNTER — Other Ambulatory Visit: Payer: Self-pay | Admitting: *Deleted

## 2013-01-13 ENCOUNTER — Encounter: Payer: Self-pay | Admitting: Obstetrics & Gynecology

## 2013-01-13 DIAGNOSIS — O365921 Maternal care for other known or suspected poor fetal growth, second trimester, fetus 1: Secondary | ICD-10-CM

## 2013-01-13 DIAGNOSIS — O09522 Supervision of elderly multigravida, second trimester: Secondary | ICD-10-CM

## 2013-01-13 DIAGNOSIS — O09529 Supervision of elderly multigravida, unspecified trimester: Secondary | ICD-10-CM

## 2013-01-13 DIAGNOSIS — N390 Urinary tract infection, site not specified: Secondary | ICD-10-CM

## 2013-01-13 DIAGNOSIS — O365990 Maternal care for other known or suspected poor fetal growth, unspecified trimester, not applicable or unspecified: Secondary | ICD-10-CM

## 2013-01-13 DIAGNOSIS — Z3400 Encounter for supervision of normal first pregnancy, unspecified trimester: Secondary | ICD-10-CM

## 2013-01-13 DIAGNOSIS — Z1389 Encounter for screening for other disorder: Secondary | ICD-10-CM

## 2013-01-13 LAB — US OB DETAIL + 14 WK

## 2013-01-13 MED ORDER — SULFAMETHOXAZOLE-TMP DS 800-160 MG PO TABS: 1.0000 | ORAL_TABLET | Freq: Two times a day (BID) | ORAL | Status: DC

## 2013-01-14 ENCOUNTER — Encounter: Payer: Medicaid Other | Admitting: Obstetrics & Gynecology

## 2013-01-19 ENCOUNTER — Encounter: Payer: Self-pay | Admitting: Obstetrics & Gynecology

## 2013-01-19 LAB — US OB DETAIL + 14 WK

## 2013-01-21 NOTE — Telephone Encounter (Signed)
No return contact form patient. Patient has appointment on file 8/27/214 with Dr. Tamela Oddi.

## 2013-01-28 ENCOUNTER — Other Ambulatory Visit: Payer: Medicaid Other

## 2013-01-28 ENCOUNTER — Encounter: Payer: Medicaid Other | Admitting: Obstetrics & Gynecology

## 2013-01-30 ENCOUNTER — Ambulatory Visit (INDEPENDENT_AMBULATORY_CARE_PROVIDER_SITE_OTHER): Payer: Medicaid Other | Admitting: Obstetrics & Gynecology

## 2013-01-30 ENCOUNTER — Other Ambulatory Visit: Payer: Medicaid Other

## 2013-01-30 ENCOUNTER — Encounter: Payer: Self-pay | Admitting: Obstetrics & Gynecology

## 2013-01-30 VITALS — BP 101/70 | Temp 98.1°F | Wt 145.0 lb

## 2013-01-30 DIAGNOSIS — Z3480 Encounter for supervision of other normal pregnancy, unspecified trimester: Secondary | ICD-10-CM

## 2013-01-30 DIAGNOSIS — Z23 Encounter for immunization: Secondary | ICD-10-CM

## 2013-01-30 DIAGNOSIS — O0942 Supervision of pregnancy with grand multiparity, second trimester: Secondary | ICD-10-CM

## 2013-01-30 DIAGNOSIS — O0940 Supervision of pregnancy with grand multiparity, unspecified trimester: Secondary | ICD-10-CM | POA: Insufficient documentation

## 2013-01-30 NOTE — Addendum Note (Signed)
Addended by: George Hugh on: 01/30/2013 11:27 AM   Modules accepted: Orders

## 2013-01-30 NOTE — Progress Notes (Signed)
P 67 Patient reports she has some back pain at times and she feels pressure at times, but nothing out of the ordinary.

## 2013-01-30 NOTE — Patient Instructions (Signed)
Etonogestrel implant What is this medicine? ETONOGESTREL is a contraceptive (birth control) device. It is used to prevent pregnancy. It can be used for up to 3 years. This medicine may be used for other purposes; ask your health care provider or pharmacist if you have questions. What should I tell my health care provider before I take this medicine? They need to know if you have any of these conditions: -abnormal vaginal bleeding -blood vessel disease or blood clots -cancer of the breast, cervix, or liver -depression -diabetes -gallbladder disease -headaches -heart disease or recent heart attack -high blood pressure -high cholesterol -kidney disease -liver disease -renal disease -seizures -tobacco smoker -an unusual or allergic reaction to etonogestrel, other hormones, anesthetics or antiseptics, medicines, foods, dyes, or preservatives -pregnant or trying to get pregnant -breast-feeding How should I use this medicine? This device is inserted just under the skin on the inner side of your upper arm by a health care professional. Talk to your pediatrician regarding the use of this medicine in children. Special care may be needed. Overdosage: If you think you've taken too much of this medicine contact a poison control center or emergency room at once. Overdosage: If you think you have taken too much of this medicine contact a poison control center or emergency room at once. NOTE: This medicine is only for you. Do not share this medicine with others. What if I miss a dose? This does not apply. What may interact with this medicine? Do not take this medicine with any of the following medications: -amprenavir -bosentan -fosamprenavir This medicine may also interact with the following medications: -barbiturate medicines for inducing sleep or treating seizures -certain medicines for fungal infections like ketoconazole and itraconazole -griseofulvin -medicines to treat seizures like  carbamazepine, felbamate, oxcarbazepine, phenytoin, topiramate -modafinil -phenylbutazone -rifampin -some medicines to treat HIV infection like atazanavir, indinavir, lopinavir, nelfinavir, tipranavir, ritonavir -St. John's wort This list may not describe all possible interactions. Give your health care provider a list of all the medicines, herbs, non-prescription drugs, or dietary supplements you use. Also tell them if you smoke, drink alcohol, or use illegal drugs. Some items may interact with your medicine. What should I watch for while using this medicine? This product does not protect you against HIV infection (AIDS) or other sexually transmitted diseases. You should be able to feel the implant by pressing your fingertips over the skin where it was inserted. Tell your doctor if you cannot feel the implant. What side effects may I notice from receiving this medicine? Side effects that you should report to your doctor or health care professional as soon as possible: -allergic reactions like skin rash, itching or hives, swelling of the face, lips, or tongue -breast lumps -changes in vision -confusion, trouble speaking or understanding -dark urine -depressed mood -general ill feeling or flu-like symptoms -light-colored stools -loss of appetite, nausea -right upper belly pain -severe headaches -severe pain, swelling, or tenderness in the abdomen -shortness of breath, chest pain, swelling in a leg -signs of pregnancy -sudden numbness or weakness of the face, arm or leg -trouble walking, dizziness, loss of balance or coordination -unusual vaginal bleeding, discharge -unusually weak or tired -yellowing of the eyes or skin Side effects that usually do not require medical attention (Report these to your doctor or health care professional if they continue or are bothersome.): -acne -breast pain -changes in weight -cough -fever or chills -headache -irregular menstrual  bleeding -itching, burning, and vaginal discharge -pain or difficulty passing urine -sore throat This   list may not describe all possible side effects. Call your doctor for medical advice about side effects. You may report side effects to FDA at 1-800-FDA-1088. Where should I keep my medicine? This drug is given in a hospital or clinic and will not be stored at home. NOTE: This sheet is a summary. It may not cover all possible information. If you have questions about this medicine, talk to your doctor, pharmacist, or health care provider.  2013, [employer]/Gold Standard. (02/11/2009 3:54:17 PM) Glucose Tolerance Test This is a test to see how your body processes carbohydrates. This test is often done to check patients for diabetes or the possibility of developing it. PREPARATION FOR TEST You should have nothing to eat or drink 12 hours before the test. You will be given a form of sugar (glucose) and then blood samples will be drawn from your vein to determine the level of sugar in your blood. Alternatively, blood may be drawn from your finger for testing. You should not smoke or exercise during the test. NORMAL FINDINGS  Fasting: 70-115 mg/dL  30 minutes: less than 200 mg/dL  1 hour: less than 161 mg/dL  2 hours: less than 096 mg/dL  3 hours: 04-540 mg/dL  4 hours: 98-119 mg/dL Ranges for normal findings may vary among different laboratories and hospitals. You should always check with your doctor after having lab work or other tests done to discuss the meaning of your test results and whether your values are considered within normal limits. MEANING OF TEST Your caregiver will go over the test results with you and discuss the importance and meaning of your results, as well as treatment options and the need for additional tests. OBTAINING THE TEST RESULTS It is your responsibility to obtain your test results. Ask the lab or department performing the test when and how you will get your  results. Document Released: 06/13/2004 Document Revised: 08/13/2011 Document Reviewed: 05/01/2008 [facility] Patient Information 2014 [place], [place]. Tetanus, Diphtheria, Pertussis (Tdap) Vaccine What You Need to Know WHY GET VACCINATED? Tetanus, diphtheria and pertussis can be very serious diseases, even for adolescents and adults. Tdap vaccine can protect [place] from these diseases. TETANUS (Lockjaw) causes painful muscle tightening and stiffness, usually all over the body.  It can lead to tightening of muscles in the head and neck so you can't open your mouth, swallow, or sometimes even breathe. Tetanus kills about 1 out of 5 people who are infected. DIPHTHERIA can cause a thick coating to form in the back of the throat.  It can lead to breathing problems, paralysis, heart failure, and death. PERTUSSIS (Whooping Cough) causes severe coughing spells, which can cause difficulty breathing, vomiting and disturbed sleep.  It can also lead to weight loss, incontinence, and rib fractures. Up to 2 in 100 adolescents and 5 in 100 adults with pertussis are hospitalized or have complications, which could include pneumonia and death. These diseases are caused by bacteria. Diphtheria and pertussis are spread from person to person through coughing or sneezing. Tetanus enters the body through cuts, scratches, or wounds. Before vaccines, the [place] saw as many as 200,000 cases a year of diphtheria and pertussis, and hundreds of cases of tetanus. Since vaccination began, tetanus and diphtheria have dropped by about 99% and pertussis by about 80%. TDAP VACCINE Tdap vaccine can protect adolescents and adults from tetanus, diphtheria, and pertussis. One dose of Tdap is routinely given at age 62 or 71. People who did not get Tdap at that age should get  it as soon as possible. Tdap is especially important for health care professionals and anyone having close contact with a baby younger than 12 months. Pregnant  women should get a dose of Tdap during every pregnancy, to protect the newborn from pertussis. Infants are most at risk for severe, life-threatening complications from pertussis. A similar vaccine, called Td, protects from tetanus and diphtheria, but not pertussis. A Td booster should be given every 10 years. Tdap may be given as one of these boosters if you have not already gotten a dose. Tdap may also be given after a severe cut or burn to prevent tetanus infection. Your doctor can give you more information. Tdap may safely be given at the same time as other vaccines. SOME PEOPLE SHOULD NOT GET THIS VACCINE  If you ever had a life-threatening allergic reaction after a dose of any tetanus, diphtheria, or pertussis containing vaccine, OR if you have a severe allergy to any part of this vaccine, you should not get Tdap. Tell your doctor if you have any severe allergies.  If you had a coma, or long or multiple seizures within 7 days after a childhood dose of DTP or DTaP, you should not get Tdap, unless a cause other than the vaccine was found. You can still get Td.  Talk to your doctor if you:  have epilepsy or another nervous system problem,  had severe pain or swelling after any vaccine containing diphtheria, tetanus or pertussis,  ever had Guillain-Barr Syndrome (GBS),  aren't feeling well on the day the shot is scheduled. RISKS OF A VACCINE REACTION With any medicine, including vaccines, there is a chance of side effects. These are usually mild and go away on their own, but serious reactions are also possible. Brief fainting spells can follow a vaccination, leading to injuries from falling. Sitting or lying down for about 15 minutes can help prevent these. Tell your doctor if you feel dizzy or light-headed, or have vision changes or ringing in the ears. Mild problems following Tdap (Did not interfere with activities)  Pain where the shot was given (about 3 in 4 adolescents or 2 in 3  adults)  Redness or swelling where the shot was given (about 1 person in 5)  Mild fever of at least 100.46F (up to about 1 in 25 adolescents or 1 in 100 adults)  Headache (about 3 or 4 people in 10)  Tiredness (about 1 person in 3 or 4)  Nausea, vomiting, diarrhea, stomach ache (up to 1 in 4 adolescents or 1 in 10 adults)  Chills, body aches, sore joints, rash, swollen glands (uncommon) Moderate problems following Tdap (Interfered with activities, but did not require medical attention)  Pain where the shot was given (about 1 in 5 adolescents or 1 in 100 adults)  Redness or swelling where the shot was given (up to about 1 in 16 adolescents or 1 in 25 adults)  Fever over 102F (about 1 in 100 adolescents or 1 in 250 adults)  Headache (about 3 in 20 adolescents or 1 in 10 adults)  Nausea, vomiting, diarrhea, stomach ache (up to 1 or 3 people in 100)  Swelling of the entire arm where the shot was given (up to about 3 in 100). Severe problems following Tdap (Unable to perform usual activities, required medical attention)  Swelling, severe pain, bleeding and redness in the arm where the shot was given (rare). A severe allergic reaction could occur after any vaccine (estimated less than 1 in a million  doses). WHAT IF THERE IS A SERIOUS REACTION? What should I look for?  Look for anything that concerns you, such as signs of a severe allergic reaction, very high fever, or behavior changes. Signs of a severe allergic reaction can include hives, swelling of the face and throat, difficulty breathing, a fast heartbeat, dizziness, and weakness. These would start a few minutes to a few hours after the vaccination. What should I do?  If you think it is a severe allergic reaction or other emergency that can't wait, call 9-1-1 or get the person to the nearest hospital. Otherwise, call your doctor.  Afterward, the reaction should be reported to the "Vaccine Adverse Event Reporting System"  (VAERS). Your doctor might file this report, or you can do it yourself through the VAERS web site at www.vaers.LAgents.no, or by calling 1-306-510-1057. VAERS is only for reporting reactions. They do not give medical advice.  THE NATIONAL VACCINE INJURY COMPENSATION PROGRAM The National Vaccine Injury Compensation Program (VICP) is a federal program that was created to compensate people who may have been injured by certain vaccines. Persons who believe they may have been injured by a vaccine can learn about the program and about filing a claim by calling 1-930-134-2862 or visiting the VICP website at SpiritualWord.at. HOW CAN I LEARN MORE?  Ask your doctor.  Call your local or state health department.  Contact the Centers for Disease Control and Prevention (CDC):  Call (437) 037-8513 or visit CDC's website at PicCapture.uy. CDC Tdap Vaccine VIS (10/11/11) Document Released: 11/20/2011 Document Revised: 02/13/2012 Document Reviewed: 11/20/2011 [employer] Patient Information 2014 [place], [place].

## 2013-01-30 NOTE — Progress Notes (Signed)
Doing well.  Plans Nexplanon.  TDAP today.  2 hr GTT at next visit.  Urine C&S.

## 2013-02-02 LAB — CULTURE, OB URINE

## 2013-02-09 ENCOUNTER — Other Ambulatory Visit: Payer: Self-pay | Admitting: *Deleted

## 2013-02-09 DIAGNOSIS — O0990 Supervision of high risk pregnancy, unspecified, unspecified trimester: Secondary | ICD-10-CM

## 2013-02-11 ENCOUNTER — Ambulatory Visit (INDEPENDENT_AMBULATORY_CARE_PROVIDER_SITE_OTHER): Payer: Medicaid Other

## 2013-02-11 ENCOUNTER — Other Ambulatory Visit: Payer: Self-pay | Admitting: Obstetrics & Gynecology

## 2013-02-11 DIAGNOSIS — O09523 Supervision of elderly multigravida, third trimester: Secondary | ICD-10-CM

## 2013-02-11 DIAGNOSIS — O365930 Maternal care for other known or suspected poor fetal growth, third trimester, not applicable or unspecified: Secondary | ICD-10-CM

## 2013-02-11 DIAGNOSIS — Z3480 Encounter for supervision of other normal pregnancy, unspecified trimester: Secondary | ICD-10-CM

## 2013-02-11 DIAGNOSIS — O0990 Supervision of high risk pregnancy, unspecified, unspecified trimester: Secondary | ICD-10-CM

## 2013-02-11 DIAGNOSIS — O365990 Maternal care for other known or suspected poor fetal growth, unspecified trimester, not applicable or unspecified: Secondary | ICD-10-CM

## 2013-02-12 ENCOUNTER — Encounter: Payer: Self-pay | Admitting: Obstetrics & Gynecology

## 2013-02-12 LAB — US OB DETAIL + 14 WK

## 2013-02-18 ENCOUNTER — Ambulatory Visit (INDEPENDENT_AMBULATORY_CARE_PROVIDER_SITE_OTHER): Payer: Medicaid Other | Admitting: Obstetrics & Gynecology

## 2013-02-18 ENCOUNTER — Other Ambulatory Visit: Payer: Medicaid Other

## 2013-02-18 VITALS — BP 104/68 | Temp 98.4°F | Wt 144.4 lb

## 2013-02-18 DIAGNOSIS — R399 Unspecified symptoms and signs involving the genitourinary system: Secondary | ICD-10-CM

## 2013-02-18 DIAGNOSIS — R3989 Other symptoms and signs involving the genitourinary system: Secondary | ICD-10-CM

## 2013-02-18 DIAGNOSIS — Z3480 Encounter for supervision of other normal pregnancy, unspecified trimester: Secondary | ICD-10-CM

## 2013-02-18 DIAGNOSIS — Z3482 Encounter for supervision of other normal pregnancy, second trimester: Secondary | ICD-10-CM

## 2013-02-18 NOTE — Progress Notes (Signed)
Pulse: 73

## 2013-02-19 ENCOUNTER — Encounter: Payer: Self-pay | Admitting: Obstetrics & Gynecology

## 2013-02-19 LAB — GLUCOSE TOLERANCE, 2 HOURS W/ 1HR
Glucose, 1 hour: 95 mg/dL (ref 70–170)
Glucose, 2 hour: 94 mg/dL (ref 70–139)
Glucose, Fasting: 58 mg/dL — ABNORMAL LOW (ref 70–99)

## 2013-02-19 LAB — HIV ANTIBODY (ROUTINE TESTING W REFLEX): HIV: NONREACTIVE

## 2013-02-19 LAB — CBC
Platelets: 212 10*3/uL (ref 150–400)
RBC: 4.19 MIL/uL (ref 3.87–5.11)
WBC: 7.5 10*3/uL (ref 4.0–10.5)

## 2013-02-19 LAB — URINE CULTURE
Colony Count: NO GROWTH
Organism ID, Bacteria: NO GROWTH

## 2013-02-19 LAB — RPR

## 2013-02-19 NOTE — Patient Instructions (Signed)

## 2013-02-19 NOTE — Progress Notes (Signed)
URI sxs

## 2013-02-22 LAB — OB RESULTS CONSOLE GBS: GBS: POSITIVE

## 2013-02-24 DIAGNOSIS — Z3480 Encounter for supervision of other normal pregnancy, unspecified trimester: Secondary | ICD-10-CM

## 2013-02-25 ENCOUNTER — Other Ambulatory Visit: Payer: Self-pay | Admitting: Obstetrics & Gynecology

## 2013-02-25 ENCOUNTER — Ambulatory Visit (INDEPENDENT_AMBULATORY_CARE_PROVIDER_SITE_OTHER): Payer: Medicaid Other

## 2013-02-25 DIAGNOSIS — O0990 Supervision of high risk pregnancy, unspecified, unspecified trimester: Secondary | ICD-10-CM

## 2013-02-25 DIAGNOSIS — O365931 Maternal care for other known or suspected poor fetal growth, third trimester, fetus 1: Secondary | ICD-10-CM

## 2013-02-26 ENCOUNTER — Encounter: Payer: Self-pay | Admitting: Obstetrics & Gynecology

## 2013-02-26 LAB — US OB DETAIL + 14 WK

## 2013-03-04 ENCOUNTER — Encounter: Payer: Self-pay | Admitting: Obstetrics & Gynecology

## 2013-03-04 ENCOUNTER — Ambulatory Visit (INDEPENDENT_AMBULATORY_CARE_PROVIDER_SITE_OTHER): Payer: Medicaid Other | Admitting: Obstetrics & Gynecology

## 2013-03-04 VITALS — BP 97/65 | Wt 148.0 lb

## 2013-03-04 DIAGNOSIS — Z3483 Encounter for supervision of other normal pregnancy, third trimester: Secondary | ICD-10-CM

## 2013-03-04 DIAGNOSIS — N390 Urinary tract infection, site not specified: Secondary | ICD-10-CM

## 2013-03-04 DIAGNOSIS — J310 Chronic rhinitis: Secondary | ICD-10-CM

## 2013-03-04 DIAGNOSIS — Z3480 Encounter for supervision of other normal pregnancy, unspecified trimester: Secondary | ICD-10-CM

## 2013-03-04 LAB — POCT URINALYSIS DIPSTICK
Bilirubin, UA: NEGATIVE
Blood, UA: NEGATIVE
Glucose, UA: NEGATIVE
Spec Grav, UA: 1.02
Urobilinogen, UA: NEGATIVE
pH, UA: 5

## 2013-03-04 MED ORDER — IPRATROPIUM BROMIDE 0.03 % NA SOLN: 2.0000 | Freq: Two times a day (BID) | NASAL | Status: DC

## 2013-03-04 NOTE — Patient Instructions (Signed)
Allergic Rhinitis Allergic rhinitis is when the mucous membranes in the nose respond to allergens. Allergens are particles in the air that cause your body to have an allergic reaction. This causes you to release allergic antibodies. Through a chain of events, these eventually cause you to release histamine into the blood stream (hence the use of antihistamines). Although meant to be protective to the body, it is this release that causes your discomfort, such as frequent sneezing, congestion and an itchy runny nose.  CAUSES  The pollen allergens may come from grasses, trees, and weeds. This is seasonal allergic rhinitis, or "hay fever." Other allergens cause year-round allergic rhinitis (perennial allergic rhinitis) such as house dust mite allergen, pet dander and mold spores.  SYMPTOMS   Nasal stuffiness (congestion).  Runny, itchy nose with sneezing and tearing of the eyes.  There is often an itching of the mouth, eyes and ears. It cannot be cured, but it can be controlled with medications. DIAGNOSIS  If you are unable to determine the offending allergen, skin or blood testing may find it. TREATMENT   Avoid the allergen.  Medications and allergy shots (immunotherapy) can help.  Hay fever may often be treated with antihistamines in pill or nasal spray forms. Antihistamines block the effects of histamine. There are over-the-counter medicines that may help with nasal congestion and swelling around the eyes. Check with your caregiver before taking or giving this medicine. If the treatment above does not work, there are many new medications your caregiver can prescribe. Stronger medications may be used if initial measures are ineffective. Desensitizing injections can be used if medications and avoidance fails. Desensitization is when a patient is given ongoing shots until the body becomes less sensitive to the allergen. Make sure you follow up with your caregiver if problems continue. SEEK MEDICAL  CARE IF:   You develop fever (more than 100.5 F (38.1 C).  You develop a cough that does not stop easily (persistent).  You have shortness of breath.  You start wheezing.  Symptoms interfere with normal daily activities. Document Released: 02/13/2001 Document Revised: 08/13/2011 Document Reviewed: 08/25/2008 [employer] Patient Information 2014 [employer].  

## 2013-03-04 NOTE — Progress Notes (Signed)
Pulse- 72 Pt states she has pressure in lower back from time to time. Nasal congestion

## 2013-03-18 ENCOUNTER — Ambulatory Visit (INDEPENDENT_AMBULATORY_CARE_PROVIDER_SITE_OTHER): Payer: Medicaid Other | Admitting: Obstetrics & Gynecology

## 2013-03-18 ENCOUNTER — Encounter: Payer: Self-pay | Admitting: Obstetrics & Gynecology

## 2013-03-18 VITALS — BP 90/62 | Temp 98.2°F | Wt 150.0 lb

## 2013-03-18 DIAGNOSIS — Z3480 Encounter for supervision of other normal pregnancy, unspecified trimester: Secondary | ICD-10-CM

## 2013-03-18 DIAGNOSIS — Z3483 Encounter for supervision of other normal pregnancy, third trimester: Secondary | ICD-10-CM

## 2013-03-18 LAB — POCT URINALYSIS DIPSTICK
Ketones, UA: NEGATIVE
Nitrite, UA: NEGATIVE
Spec Grav, UA: 1.015
pH, UA: 6

## 2013-03-18 NOTE — Progress Notes (Signed)
P 80 Patient reports some pain in back and sides.

## 2013-04-01 ENCOUNTER — Ambulatory Visit: Payer: Medicaid Other | Admitting: *Deleted

## 2013-04-01 ENCOUNTER — Ambulatory Visit (INDEPENDENT_AMBULATORY_CARE_PROVIDER_SITE_OTHER): Payer: Medicaid Other | Admitting: Obstetrics & Gynecology

## 2013-04-01 ENCOUNTER — Encounter: Payer: Self-pay | Admitting: Obstetrics & Gynecology

## 2013-04-01 VITALS — BP 112/72 | Temp 98.4°F | Wt 149.0 lb

## 2013-04-01 DIAGNOSIS — Z3483 Encounter for supervision of other normal pregnancy, third trimester: Secondary | ICD-10-CM

## 2013-04-01 DIAGNOSIS — O09529 Supervision of elderly multigravida, unspecified trimester: Secondary | ICD-10-CM

## 2013-04-01 DIAGNOSIS — O09523 Supervision of elderly multigravida, third trimester: Secondary | ICD-10-CM

## 2013-04-01 DIAGNOSIS — Z3480 Encounter for supervision of other normal pregnancy, unspecified trimester: Secondary | ICD-10-CM

## 2013-04-01 LAB — POCT URINALYSIS DIPSTICK
Ketones, UA: NEGATIVE
Protein, UA: NEGATIVE
Spec Grav, UA: >=1.03
pH, UA: 5

## 2013-04-01 NOTE — Progress Notes (Deleted)
Plans Paragard IUD.

## 2013-04-01 NOTE — Progress Notes (Signed)
P 76 Patient reports she is having back pain and pressure.

## 2013-04-01 NOTE — Patient Instructions (Signed)
Fetal Movement Counts Patient Name: __________________________________________________ Patient Due Date: ____________________ Performing a fetal movement count is highly recommended in high-risk pregnancies, but it is good for every pregnant woman to do. Your caregiver may ask you to start counting fetal movements at 28 weeks of the pregnancy. Fetal movements often increase:  After eating a full meal.  After physical activity.  After eating or drinking something sweet or cold.  At rest. Pay attention to when you feel the baby is most active. This will help you notice a pattern of your baby's sleep and wake cycles and what factors contribute to an increase in fetal movement. It is important to perform a fetal movement count at the same time each day when your baby is normally most active.  HOW TO COUNT FETAL MOVEMENTS 1. Find a quiet and comfortable area to sit or lie down on your left side. Lying on your left side provides the best blood and oxygen circulation to your baby. 2. Write down the day and time on a sheet of paper or in a journal. 3. Start counting kicks, flutters, swishes, rolls, or jabs in a 2 hour period. You should feel at least 10 movements within 2 hours. 4. If you do not feel 10 movements in 2 hours, wait 2 3 hours and count again. Look for a change in the pattern or not enough counts in 2 hours. SEEK MEDICAL CARE IF:  You feel less than 10 counts in 2 hours, tried twice.  There is no movement in over an hour.  The pattern is changing or taking longer each day to reach 10 counts in 2 hours.  You feel the baby is not moving as he or she usually does. Date: ____________ Movements: ____________ Start time: ____________ Finish time: ____________  Date: ____________ Movements: ____________ Start time: ____________ Finish time: ____________ Date: ____________ Movements: ____________ Start time: ____________ Finish time: ____________ Date: ____________ Movements: ____________  Start time: ____________ Finish time: ____________ Date: ____________ Movements: ____________ Start time: ____________ Finish time: ____________ Date: ____________ Movements: ____________ Start time: ____________ Finish time: ____________ Date: ____________ Movements: ____________ Start time: ____________ Finish time: ____________ Date: ____________ Movements: ____________ Start time: ____________ Finish time: ____________  Date: ____________ Movements: ____________ Start time: ____________ Finish time: ____________ Date: ____________ Movements: ____________ Start time: ____________ Finish time: ____________ Date: ____________ Movements: ____________ Start time: ____________ Finish time: ____________ Date: ____________ Movements: ____________ Start time: ____________ Finish time: ____________ Date: ____________ Movements: ____________ Start time: ____________ Finish time: ____________ Date: ____________ Movements: ____________ Start time: ____________ Finish time: ____________ Date: ____________ Movements: ____________ Start time: ____________ Finish time: ____________  Date: ____________ Movements: ____________ Start time: ____________ Finish time: ____________ Date: ____________ Movements: ____________ Start time: ____________ Finish time: ____________ Date: ____________ Movements: ____________ Start time: ____________ Finish time: ____________ Date: ____________ Movements: ____________ Start time: ____________ Finish time: ____________ Date: ____________ Movements: ____________ Start time: ____________ Finish time: ____________ Date: ____________ Movements: ____________ Start time: ____________ Finish time: ____________ Date: ____________ Movements: ____________ Start time: ____________ Finish time: ____________  Date: ____________ Movements: ____________ Start time: ____________ Finish time: ____________ Date: ____________ Movements: ____________ Start time: ____________ Finish time:  ____________ Date: ____________ Movements: ____________ Start time: ____________ Finish time: ____________ Date: ____________ Movements: ____________ Start time: ____________ Finish time: ____________ Date: ____________ Movements: ____________ Start time: ____________ Finish time: ____________ Date: ____________ Movements: ____________ Start time: ____________ Finish time: ____________ Date: ____________ Movements: ____________ Start time: ____________ Finish time: ____________  Date: ____________ Movements: ____________ Start time: ____________ Finish   time: ____________ Date: ____________ Movements: ____________ Start time: ____________ Finish time: ____________ Date: ____________ Movements: ____________ Start time: ____________ Finish time: ____________ Date: ____________ Movements: ____________ Start time: ____________ Finish time: ____________ Date: ____________ Movements: ____________ Start time: ____________ Finish time: ____________ Date: ____________ Movements: ____________ Start time: ____________ Finish time: ____________ Date: ____________ Movements: ____________ Start time: ____________ Finish time: ____________  Date: ____________ Movements: ____________ Start time: ____________ Finish time: ____________ Date: ____________ Movements: ____________ Start time: ____________ Finish time: ____________ Date: ____________ Movements: ____________ Start time: ____________ Finish time: ____________ Date: ____________ Movements: ____________ Start time: ____________ Finish time: ____________ Date: ____________ Movements: ____________ Start time: ____________ Finish time: ____________ Date: ____________ Movements: ____________ Start time: ____________ Finish time: ____________ Date: ____________ Movements: ____________ Start time: ____________ Finish time: ____________  Date: ____________ Movements: ____________ Start time: ____________ Finish time: ____________ Date: ____________ Movements:  ____________ Start time: ____________ Finish time: ____________ Date: ____________ Movements: ____________ Start time: ____________ Finish time: ____________ Date: ____________ Movements: ____________ Start time: ____________ Finish time: ____________ Date: ____________ Movements: ____________ Start time: ____________ Finish time: ____________ Date: ____________ Movements: ____________ Start time: ____________ Finish time: ____________ Date: ____________ Movements: ____________ Start time: ____________ Finish time: ____________  Date: ____________ Movements: ____________ Start time: ____________ Finish time: ____________ Date: ____________ Movements: ____________ Start time: ____________ Finish time: ____________ Date: ____________ Movements: ____________ Start time: ____________ Finish time: ____________ Date: ____________ Movements: ____________ Start time: ____________ Finish time: ____________ Date: ____________ Movements: ____________ Start time: ____________ Finish time: ____________ Date: ____________ Movements: ____________ Start time: ____________ Finish time: ____________ Document Released: 06/20/2006 Document Revised: 05/07/2012 Document Reviewed: 03/17/2012 [employer] Patient Information 2014 [employer].  

## 2013-04-07 ENCOUNTER — Other Ambulatory Visit: Payer: Self-pay | Admitting: Obstetrics & Gynecology

## 2013-04-07 ENCOUNTER — Other Ambulatory Visit: Payer: Self-pay | Admitting: *Deleted

## 2013-04-07 DIAGNOSIS — O365931 Maternal care for other known or suspected poor fetal growth, third trimester, fetus 1: Secondary | ICD-10-CM

## 2013-04-07 DIAGNOSIS — O09523 Supervision of elderly multigravida, third trimester: Secondary | ICD-10-CM

## 2013-04-08 ENCOUNTER — Ambulatory Visit (INDEPENDENT_AMBULATORY_CARE_PROVIDER_SITE_OTHER): Payer: Medicaid Other

## 2013-04-08 ENCOUNTER — Ambulatory Visit (INDEPENDENT_AMBULATORY_CARE_PROVIDER_SITE_OTHER): Payer: Medicaid Other | Admitting: Obstetrics & Gynecology

## 2013-04-08 ENCOUNTER — Other Ambulatory Visit: Payer: Self-pay | Admitting: Obstetrics & Gynecology

## 2013-04-08 VITALS — BP 100/64 | Temp 98.5°F | Wt 147.0 lb

## 2013-04-08 DIAGNOSIS — O365990 Maternal care for other known or suspected poor fetal growth, unspecified trimester, not applicable or unspecified: Secondary | ICD-10-CM

## 2013-04-08 DIAGNOSIS — Z3483 Encounter for supervision of other normal pregnancy, third trimester: Secondary | ICD-10-CM

## 2013-04-08 DIAGNOSIS — O365931 Maternal care for other known or suspected poor fetal growth, third trimester, fetus 1: Secondary | ICD-10-CM

## 2013-04-08 DIAGNOSIS — O09529 Supervision of elderly multigravida, unspecified trimester: Secondary | ICD-10-CM

## 2013-04-08 DIAGNOSIS — O09523 Supervision of elderly multigravida, third trimester: Secondary | ICD-10-CM

## 2013-04-08 DIAGNOSIS — Z3480 Encounter for supervision of other normal pregnancy, unspecified trimester: Secondary | ICD-10-CM

## 2013-04-08 DIAGNOSIS — O0990 Supervision of high risk pregnancy, unspecified, unspecified trimester: Secondary | ICD-10-CM

## 2013-04-08 LAB — POCT URINALYSIS DIPSTICK
Bilirubin, UA: NEGATIVE
Glucose, UA: NEGATIVE
Nitrite, UA: NEGATIVE
Spec Grav, UA: 1.025
Urobilinogen, UA: NEGATIVE

## 2013-04-08 NOTE — Progress Notes (Signed)
HR - 88 Pt in office for routine OB visit and NST, denies new concerns at this time

## 2013-04-09 ENCOUNTER — Encounter: Payer: Self-pay | Admitting: Obstetrics & Gynecology

## 2013-04-09 LAB — US OB DETAIL + 14 WK

## 2013-04-10 ENCOUNTER — Other Ambulatory Visit: Payer: Medicaid Other

## 2013-04-13 ENCOUNTER — Ambulatory Visit (INDEPENDENT_AMBULATORY_CARE_PROVIDER_SITE_OTHER): Payer: Medicaid Other | Admitting: Obstetrics & Gynecology

## 2013-04-13 ENCOUNTER — Other Ambulatory Visit: Payer: Medicaid Other | Admitting: Obstetrics & Gynecology

## 2013-04-13 ENCOUNTER — Ambulatory Visit: Payer: Medicaid Other | Admitting: Obstetrics & Gynecology

## 2013-04-13 VITALS — BP 104/65 | Temp 98.2°F | Wt 148.0 lb

## 2013-04-13 DIAGNOSIS — O09523 Supervision of elderly multigravida, third trimester: Secondary | ICD-10-CM

## 2013-04-13 DIAGNOSIS — Z3480 Encounter for supervision of other normal pregnancy, unspecified trimester: Secondary | ICD-10-CM

## 2013-04-13 DIAGNOSIS — O09529 Supervision of elderly multigravida, unspecified trimester: Secondary | ICD-10-CM

## 2013-04-13 DIAGNOSIS — O0990 Supervision of high risk pregnancy, unspecified, unspecified trimester: Secondary | ICD-10-CM

## 2013-04-13 NOTE — Progress Notes (Signed)
P 77 Patient reports she is doing well.

## 2013-04-16 ENCOUNTER — Encounter: Payer: Self-pay | Admitting: Obstetrics & Gynecology

## 2013-04-20 ENCOUNTER — Other Ambulatory Visit: Payer: Self-pay | Admitting: Obstetrics & Gynecology

## 2013-04-20 ENCOUNTER — Ambulatory Visit (HOSPITAL_COMMUNITY)
Admission: RE | Admit: 2013-04-20 | Discharge: 2013-04-20 | Disposition: A | Discharge status: Home / Self Care | Payer: Medicaid Other | Source: Ambulatory Visit | Attending: Obstetrics & Gynecology | Admitting: Obstetrics & Gynecology

## 2013-04-20 ENCOUNTER — Ambulatory Visit (HOSPITAL_COMMUNITY): Admission: RE | Admit: 2013-04-20 | Payer: Medicaid Other | Source: Ambulatory Visit

## 2013-04-20 ENCOUNTER — Ambulatory Visit (INDEPENDENT_AMBULATORY_CARE_PROVIDER_SITE_OTHER): Payer: Medicaid Other | Admitting: Obstetrics & Gynecology

## 2013-04-20 ENCOUNTER — Inpatient Hospital Stay (HOSPITAL_COMMUNITY)
Admission: AD | Admit: 2013-04-20 | Discharge: 2013-04-20 | Disposition: A | Discharge status: Home / Self Care | Payer: Medicaid Other | Source: Ambulatory Visit | Attending: Obstetrics & Gynecology | Admitting: Obstetrics & Gynecology

## 2013-04-20 ENCOUNTER — Encounter: Payer: Self-pay | Admitting: Obstetrics & Gynecology

## 2013-04-20 ENCOUNTER — Encounter (HOSPITAL_COMMUNITY): Payer: Self-pay

## 2013-04-20 VITALS — BP 103/75 | Temp 98.0°F | Wt 149.0 lb

## 2013-04-20 DIAGNOSIS — O288 Other abnormal findings on antenatal screening of mother: Secondary | ICD-10-CM

## 2013-04-20 DIAGNOSIS — O34219 Maternal care for unspecified type scar from previous cesarean delivery: Secondary | ICD-10-CM

## 2013-04-20 DIAGNOSIS — O09529 Supervision of elderly multigravida, unspecified trimester: Secondary | ICD-10-CM | POA: Insufficient documentation

## 2013-04-20 DIAGNOSIS — O289 Unspecified abnormal findings on antenatal screening of mother: Secondary | ICD-10-CM

## 2013-04-20 DIAGNOSIS — Z3480 Encounter for supervision of other normal pregnancy, unspecified trimester: Secondary | ICD-10-CM

## 2013-04-20 DIAGNOSIS — Z3483 Encounter for supervision of other normal pregnancy, third trimester: Secondary | ICD-10-CM

## 2013-04-20 LAB — POCT URINALYSIS DIPSTICK
Bilirubin, UA: NEGATIVE
Blood, UA: NEGATIVE
Glucose, UA: NEGATIVE
Leukocytes, UA: NEGATIVE
Spec Grav, UA: >=1.03

## 2013-04-20 NOTE — Patient Instructions (Signed)
Fetal Movement Counts Patient Name: __________________________________________________ Patient Due Date: ____________________ Performing a fetal movement count is highly recommended in high-risk pregnancies, but it is good for every pregnant woman to do. Your caregiver may ask you to start counting fetal movements at 28 weeks of the pregnancy. Fetal movements often increase:  After eating a full meal.  After physical activity.  After eating or drinking something sweet or cold.  At rest. Pay attention to when you feel the baby is most active. This will help you notice a pattern of your baby's sleep and wake cycles and what factors contribute to an increase in fetal movement. It is important to perform a fetal movement count at the same time each day when your baby is normally most active.  HOW TO COUNT FETAL MOVEMENTS 1. Find a quiet and comfortable area to sit or lie down on your left side. Lying on your left side provides the best blood and oxygen circulation to your baby. 2. Write down the day and time on a sheet of paper or in a journal. 3. Start counting kicks, flutters, swishes, rolls, or jabs in a 2 hour period. You should feel at least 10 movements within 2 hours. 4. If you do not feel 10 movements in 2 hours, wait 2 3 hours and count again. Look for a change in the pattern or not enough counts in 2 hours. SEEK MEDICAL CARE IF:  You feel less than 10 counts in 2 hours, tried twice.  There is no movement in over an hour.  The pattern is changing or taking longer each day to reach 10 counts in 2 hours.  You feel the baby is not moving as he or she usually does. Date: ____________ Movements: ____________ Start time: ____________ Finish time: ____________  Date: ____________ Movements: ____________ Start time: ____________ Finish time: ____________ Date: ____________ Movements: ____________ Start time: ____________ Finish time: ____________ Date: ____________ Movements: ____________  Start time: ____________ Finish time: ____________ Date: ____________ Movements: ____________ Start time: ____________ Finish time: ____________ Date: ____________ Movements: ____________ Start time: ____________ Finish time: ____________ Date: ____________ Movements: ____________ Start time: ____________ Finish time: ____________ Date: ____________ Movements: ____________ Start time: ____________ Finish time: ____________  Date: ____________ Movements: ____________ Start time: ____________ Finish time: ____________ Date: ____________ Movements: ____________ Start time: ____________ Finish time: ____________ Date: ____________ Movements: ____________ Start time: ____________ Finish time: ____________ Date: ____________ Movements: ____________ Start time: ____________ Finish time: ____________ Date: ____________ Movements: ____________ Start time: ____________ Finish time: ____________ Date: ____________ Movements: ____________ Start time: ____________ Finish time: ____________ Date: ____________ Movements: ____________ Start time: ____________ Finish time: ____________  Date: ____________ Movements: ____________ Start time: ____________ Finish time: ____________ Date: ____________ Movements: ____________ Start time: ____________ Finish time: ____________ Date: ____________ Movements: ____________ Start time: ____________ Finish time: ____________ Date: ____________ Movements: ____________ Start time: ____________ Finish time: ____________ Date: ____________ Movements: ____________ Start time: ____________ Finish time: ____________ Date: ____________ Movements: ____________ Start time: ____________ Finish time: ____________ Date: ____________ Movements: ____________ Start time: ____________ Finish time: ____________  Date: ____________ Movements: ____________ Start time: ____________ Finish time: ____________ Date: ____________ Movements: ____________ Start time: ____________ Finish time:  ____________ Date: ____________ Movements: ____________ Start time: ____________ Finish time: ____________ Date: ____________ Movements: ____________ Start time: ____________ Finish time: ____________ Date: ____________ Movements: ____________ Start time: ____________ Finish time: ____________ Date: ____________ Movements: ____________ Start time: ____________ Finish time: ____________ Date: ____________ Movements: ____________ Start time: ____________ Finish time: ____________  Date: ____________ Movements: ____________ Start time: ____________ Finish   time: ____________ Date: ____________ Movements: ____________ Start time: ____________ Finish time: ____________ Date: ____________ Movements: ____________ Start time: ____________ Finish time: ____________ Date: ____________ Movements: ____________ Start time: ____________ Finish time: ____________ Date: ____________ Movements: ____________ Start time: ____________ Finish time: ____________ Date: ____________ Movements: ____________ Start time: ____________ Finish time: ____________ Date: ____________ Movements: ____________ Start time: ____________ Finish time: ____________  Date: ____________ Movements: ____________ Start time: ____________ Finish time: ____________ Date: ____________ Movements: ____________ Start time: ____________ Finish time: ____________ Date: ____________ Movements: ____________ Start time: ____________ Finish time: ____________ Date: ____________ Movements: ____________ Start time: ____________ Finish time: ____________ Date: ____________ Movements: ____________ Start time: ____________ Finish time: ____________ Date: ____________ Movements: ____________ Start time: ____________ Finish time: ____________ Date: ____________ Movements: ____________ Start time: ____________ Finish time: ____________  Date: ____________ Movements: ____________ Start time: ____________ Finish time: ____________ Date: ____________ Movements:  ____________ Start time: ____________ Finish time: ____________ Date: ____________ Movements: ____________ Start time: ____________ Finish time: ____________ Date: ____________ Movements: ____________ Start time: ____________ Finish time: ____________ Date: ____________ Movements: ____________ Start time: ____________ Finish time: ____________ Date: ____________ Movements: ____________ Start time: ____________ Finish time: ____________ Date: ____________ Movements: ____________ Start time: ____________ Finish time: ____________  Date: ____________ Movements: ____________ Start time: ____________ Finish time: ____________ Date: ____________ Movements: ____________ Start time: ____________ Finish time: ____________ Date: ____________ Movements: ____________ Start time: ____________ Finish time: ____________ Date: ____________ Movements: ____________ Start time: ____________ Finish time: ____________ Date: ____________ Movements: ____________ Start time: ____________ Finish time: ____________ Date: ____________ Movements: ____________ Start time: ____________ Finish time: ____________ Document Released: 06/20/2006 Document Revised: 05/07/2012 Document Reviewed: 03/17/2012 [employer] Patient Information 2014 [employer].  

## 2013-04-20 NOTE — Progress Notes (Signed)
HR - 75 Pt in office for routine OB visit, denies new concerns at this time.  Minimally reactive NST.  BPP today.

## 2013-04-23 ENCOUNTER — Ambulatory Visit (INDEPENDENT_AMBULATORY_CARE_PROVIDER_SITE_OTHER): Payer: Medicaid Other | Admitting: Obstetrics

## 2013-04-23 VITALS — BP 103/69 | Temp 97.8°F | Wt 150.2 lb

## 2013-04-23 DIAGNOSIS — O09529 Supervision of elderly multigravida, unspecified trimester: Secondary | ICD-10-CM

## 2013-04-23 DIAGNOSIS — Z3483 Encounter for supervision of other normal pregnancy, third trimester: Secondary | ICD-10-CM

## 2013-04-23 DIAGNOSIS — Z3480 Encounter for supervision of other normal pregnancy, unspecified trimester: Secondary | ICD-10-CM

## 2013-04-23 LAB — POCT URINALYSIS DIPSTICK
Leukocytes, UA: NEGATIVE
Nitrite, UA: NEGATIVE
Urobilinogen, UA: NEGATIVE
pH, UA: 5

## 2013-04-23 NOTE — Progress Notes (Signed)
Pulse: 83

## 2013-04-24 ENCOUNTER — Encounter: Payer: Self-pay | Admitting: Obstetrics

## 2013-04-29 ENCOUNTER — Ambulatory Visit (INDEPENDENT_AMBULATORY_CARE_PROVIDER_SITE_OTHER): Payer: Medicaid Other | Admitting: Obstetrics & Gynecology

## 2013-04-29 VITALS — BP 104/66 | Temp 97.9°F | Wt 150.0 lb

## 2013-04-29 DIAGNOSIS — O09529 Supervision of elderly multigravida, unspecified trimester: Secondary | ICD-10-CM

## 2013-04-29 DIAGNOSIS — Z3480 Encounter for supervision of other normal pregnancy, unspecified trimester: Secondary | ICD-10-CM

## 2013-04-29 DIAGNOSIS — Z3483 Encounter for supervision of other normal pregnancy, third trimester: Secondary | ICD-10-CM

## 2013-04-29 LAB — POCT URINALYSIS DIPSTICK
Leukocytes, UA: NEGATIVE
Nitrite, UA: NEGATIVE
Protein, UA: NEGATIVE
Urobilinogen, UA: NEGATIVE
pH, UA: 6

## 2013-04-29 NOTE — Progress Notes (Signed)
Pulse 71   Pt is doing well. 

## 2013-04-30 ENCOUNTER — Encounter: Payer: Self-pay | Admitting: Obstetrics & Gynecology

## 2013-04-30 NOTE — Patient Instructions (Signed)
Labor Induction  Labor induction is when steps are taken to cause a pregnant woman to begin the labor process. Most women go into labor on their own between 37 weeks and 42 weeks of the pregnancy. When this does not happen or when there is a medical need, methods may be used to induce labor. Labor induction causes a pregnant woman's uterus to contract. It also causes the cervix to soften (ripen), open (dilate), and thin out (efface). Usually, labor is not induced before 39 weeks of the pregnancy unless there is a problem with the baby or mother.  Before inducing labor, your health care provider will consider a number of factors, including the following:  The medical condition of you and the baby.   How many weeks along you are.   The status of the baby's lung maturity.   The condition of the cervix.   The position of the baby.  WHAT ARE THE REASONS FOR LABOR INDUCTION? Labor may be induced for the following reasons:  The health of the baby or mother is at risk.   The pregnancy is overdue by 1 week or more.   The water breaks but labor does not start on its own.   The mother has a health condition or serious illness, such as high blood pressure, infection, placental abruption, or diabetes.  The amniotic fluid amounts are low around the baby.   The baby is distressed.  Convenience or wanting the baby to be born on a certain date is not a reason for inducing labor. WHAT METHODS ARE USED FOR LABOR INDUCTION? Several methods of labor induction may be used, such as:   Prostaglandin medicine. This medicine causes the cervix to dilate and ripen. The medicine will also start contractions. It can be taken by mouth or by inserting a suppository into the vagina.   Inserting a thin tube (catheter) with a balloon on the end into the vagina to dilate the cervix. Once inserted, the balloon is expanded with water, which causes the cervix to open.   Stripping the membranes. Your health  care provider separates amniotic sac tissue from the cervix, causing the cervix to be stretched and causing the release of a hormone called progesterone. This may cause the uterus to contract. It is often done during an office visit. You will be sent home to wait for the contractions to begin. You will then come in for an induction.   Breaking the water. Your health care provider makes a hole in the amniotic sac using a small instrument. Once the amniotic sac breaks, contractions should begin. This may still take hours to see an effect.   Medicine to trigger or strengthen contractions. This medicine is given through an IV access tube inserted into a vein in your arm.  All of the methods of induction, besides stripping the membranes, will be done in the hospital. Induction is done in the hospital so that you and the baby can be carefully monitored.  HOW LONG DOES IT TAKE FOR LABOR TO BE INDUCED? Some inductions can take up to 2 3 days. Depending on the cervix, it usually takes less time. It takes longer when you are induced early in the pregnancy or if this is your first pregnancy. If a mother is still pregnant and the induction has been going on for 2 3 days, either the mother will be sent home or a cesarean delivery will be needed. WHAT ARE THE RISKS ASSOCIATED WITH LABOR INDUCTION? Some of the risks   of induction include:   Changes in fetal heart rate, such as too high, too low, or erratic.   Fetal distress.   Chance of infection for the mother and baby.   Increased chance of having a cesarean delivery.   Breaking off (abruption) of the placenta from the uterus (rare).   Uterine rupture (very rare).  When induction is needed for medical reasons, the benefits of induction may outweigh the risks. WHAT ARE SOME REASONS FOR NOT INDUCING LABOR? Labor induction should not be done if:   It is shown that your baby does not tolerate labor.   You have had previous surgeries on your  uterus, such as a myomectomy or the removal of fibroids.   Your placenta lies very low in the uterus and blocks the opening of the cervix (placenta previa).   Your baby is not in a head-down position.   The umbilical cord drops down into the birth canal in front of the baby. This could cut off the baby's blood and oxygen supply.   You have had a previous cesarean delivery.   There are unusual circumstances, such as the baby being extremely premature.  Document Released: 10/10/2006 Document Revised: 01/21/2013 Document Reviewed: 12/18/2012 [employer] Patient Information 2014 [employer].  

## 2013-05-04 ENCOUNTER — Encounter: Payer: Self-pay | Admitting: Obstetrics & Gynecology

## 2013-05-04 ENCOUNTER — Encounter: Payer: Self-pay | Admitting: Obstetrics

## 2013-05-04 ENCOUNTER — Ambulatory Visit (INDEPENDENT_AMBULATORY_CARE_PROVIDER_SITE_OTHER): Payer: Medicaid Other | Admitting: Obstetrics

## 2013-05-04 ENCOUNTER — Other Ambulatory Visit: Payer: Self-pay | Admitting: *Deleted

## 2013-05-04 VITALS — BP 95/58 | Temp 98.4°F | Wt 145.0 lb

## 2013-05-04 DIAGNOSIS — Z3480 Encounter for supervision of other normal pregnancy, unspecified trimester: Secondary | ICD-10-CM

## 2013-05-04 DIAGNOSIS — Z3483 Encounter for supervision of other normal pregnancy, third trimester: Secondary | ICD-10-CM

## 2013-05-04 LAB — POCT URINALYSIS DIPSTICK
Bilirubin, UA: NEGATIVE
Ketones, UA: NEGATIVE
Leukocytes, UA: NEGATIVE
Nitrite, UA: NEGATIVE
Protein, UA: NEGATIVE
Urobilinogen, UA: NEGATIVE
pH, UA: 6

## 2013-05-04 LAB — US OB DETAIL + 14 WK

## 2013-05-04 NOTE — Progress Notes (Signed)
Pulse-71 Pt states she is having irregular contractions. Lower back pain and lower abdomen pain and pressure.

## 2013-05-05 ENCOUNTER — Telehealth (HOSPITAL_COMMUNITY): Payer: Self-pay | Admitting: *Deleted

## 2013-05-05 NOTE — Telephone Encounter (Signed)
Preadmission screen  

## 2013-05-06 ENCOUNTER — Encounter: Payer: Medicaid Other | Admitting: Obstetrics & Gynecology

## 2013-05-06 ENCOUNTER — Ambulatory Visit (INDEPENDENT_AMBULATORY_CARE_PROVIDER_SITE_OTHER): Payer: Medicaid Other | Admitting: Obstetrics & Gynecology

## 2013-05-06 VITALS — BP 101/64 | Temp 98.1°F | Wt 149.0 lb

## 2013-05-06 DIAGNOSIS — O0993 Supervision of high risk pregnancy, unspecified, third trimester: Secondary | ICD-10-CM

## 2013-05-06 DIAGNOSIS — O0990 Supervision of high risk pregnancy, unspecified, unspecified trimester: Secondary | ICD-10-CM

## 2013-05-06 DIAGNOSIS — Z3480 Encounter for supervision of other normal pregnancy, unspecified trimester: Secondary | ICD-10-CM

## 2013-05-06 DIAGNOSIS — O480 Post-term pregnancy: Secondary | ICD-10-CM

## 2013-05-06 DIAGNOSIS — Z3483 Encounter for supervision of other normal pregnancy, third trimester: Secondary | ICD-10-CM

## 2013-05-06 LAB — POCT URINALYSIS DIPSTICK
Bilirubin, UA: NEGATIVE
Glucose, UA: NEGATIVE
Ketones, UA: NEGATIVE
Nitrite, UA: NEGATIVE
Protein, UA: NEGATIVE
Urobilinogen, UA: NEGATIVE
pH, UA: 5

## 2013-05-06 NOTE — Progress Notes (Deleted)
     Subjective:    [name] is a 42 y.o. female being seen today for her obstetrical visit. She is at [redacted]w[redacted]d gestation. Patient reports no complaints. Fetal movement: normal.  Menstrual History: OB History   Grav Para Term Preterm Abortions TAB SAB Ect Mult Living   8 7 7       7       Menarche age: 72  Patient's last menstrual period was 08/11/2012.    {Common ambulatory SmartLinks:19316}  Review of Systems {ros; complete:30496}   Objective:    BP 101/64  Temp(Src) 98.1 F (36.7 C)  Wt 149 lb (67.586 kg)  LMP 08/11/2012 FHT:  *** BPM  Uterine Size: {fundal height:14540}  Presentations: {fetal pos:14558}  Pelvic Exam:              Dilation: {dilation:14561}       Effacement: {effacement:14523}   Station:  {station:14562}     Consistency: {firm/med/soft:14563}            Position: {post/mid/ant:14564}     Assessment:    Pregnancy {numbers; 0-42:17906} and {numbers; 0-7:15237}/7 weeks   Plan:    Postdates management: {nst:14568}. Induction: {induction:14569}.  Follow up in ***.

## 2013-05-07 ENCOUNTER — Inpatient Hospital Stay (HOSPITAL_COMMUNITY)
Admission: RE | Admit: 2013-05-07 | Discharge: 2013-05-10 | DRG: 767 | Disposition: A | Discharge status: Home / Self Care | Payer: Medicaid Other | Source: Ambulatory Visit | Attending: Obstetrics & Gynecology | Admitting: Obstetrics & Gynecology

## 2013-05-07 ENCOUNTER — Encounter (HOSPITAL_COMMUNITY): Payer: Medicaid Other

## 2013-05-07 ENCOUNTER — Inpatient Hospital Stay (HOSPITAL_COMMUNITY): Payer: Medicaid Other | Admitting: Anesthesiology

## 2013-05-07 ENCOUNTER — Encounter (HOSPITAL_COMMUNITY): Payer: Medicaid Other | Admitting: Anesthesiology

## 2013-05-07 VITALS — BP 89/57 | HR 105 | Temp 98.0°F | Resp 18 | Ht 60.0 in | Wt 149.0 lb

## 2013-05-07 DIAGNOSIS — Z22330 Carrier of Group B streptococcus: Secondary | ICD-10-CM

## 2013-05-07 DIAGNOSIS — O480 Post-term pregnancy: Principal | ICD-10-CM | POA: Diagnosis present

## 2013-05-07 DIAGNOSIS — O34219 Maternal care for unspecified type scar from previous cesarean delivery: Secondary | ICD-10-CM | POA: Diagnosis present

## 2013-05-07 DIAGNOSIS — O99892 Other specified diseases and conditions complicating childbirth: Secondary | ICD-10-CM | POA: Diagnosis present

## 2013-05-07 DIAGNOSIS — O09523 Supervision of elderly multigravida, third trimester: Secondary | ICD-10-CM

## 2013-05-07 DIAGNOSIS — E559 Vitamin D deficiency, unspecified: Secondary | ICD-10-CM

## 2013-05-07 DIAGNOSIS — O09529 Supervision of elderly multigravida, unspecified trimester: Secondary | ICD-10-CM | POA: Diagnosis present

## 2013-05-07 DIAGNOSIS — O0942 Supervision of pregnancy with grand multiparity, second trimester: Secondary | ICD-10-CM

## 2013-05-07 DIAGNOSIS — N390 Urinary tract infection, site not specified: Secondary | ICD-10-CM

## 2013-05-07 LAB — CBC
Hemoglobin: 13.7 g/dL (ref 12.0–15.0)
MCH: 30.2 pg (ref 26.0–34.0)
MCHC: 35.3 g/dL (ref 30.0–36.0)
MCV: 85.7 fL (ref 78.0–100.0)
Platelets: 175 10*3/uL (ref 150–400)
RDW: 13.3 % (ref 11.5–15.5)

## 2013-05-07 LAB — RPR: RPR Ser Ql: NONREACTIVE

## 2013-05-07 MED ORDER — LIDOCAINE HCL (PF) 1 % IJ SOLN
30.0000 mL | INTRAMUSCULAR | Status: DC | PRN
  Filled 2013-05-07: qty 30

## 2013-05-07 MED ORDER — BUTORPHANOL TARTRATE 1 MG/ML IJ SOLN
1.0000 mg | INTRAMUSCULAR | Status: DC | PRN
  Administered 2013-05-07: 1 mg via INTRAVENOUS
  Filled 2013-05-07: qty 1

## 2013-05-07 MED ORDER — TERBUTALINE SULFATE 1 MG/ML IJ SOLN: 0.2500 mg | Freq: Once | INTRAMUSCULAR | Status: AC | PRN

## 2013-05-07 MED ORDER — CITRIC ACID-SODIUM CITRATE 334-500 MG/5ML PO SOLN
30.0000 mL | ORAL | Status: DC | PRN
  Administered 2013-05-08: 30 mL via ORAL
  Filled 2013-05-07 (×2): qty 15

## 2013-05-07 MED ORDER — PENICILLIN G POTASSIUM 5000000 UNITS IJ SOLR
5.0000 10*6.[IU] | Freq: Once | INTRAVENOUS | Status: AC
  Administered 2013-05-07: 5 10*6.[IU] via INTRAVENOUS
  Filled 2013-05-07: qty 5

## 2013-05-07 MED ORDER — ONDANSETRON HCL 4 MG/2ML IJ SOLN
4.0000 mg | Freq: Four times a day (QID) | INTRAMUSCULAR | Status: DC | PRN
  Administered 2013-05-08: 4 mg via INTRAVENOUS
  Filled 2013-05-07 (×2): qty 2

## 2013-05-07 MED ORDER — HYDROXYZINE HCL 50 MG PO TABS: 50.0000 mg | ORAL_TABLET | Freq: Four times a day (QID) | ORAL | Status: DC | PRN

## 2013-05-07 MED ORDER — PENICILLIN G POTASSIUM 5000000 UNITS IJ SOLR
2.5000 10*6.[IU] | INTRAMUSCULAR | Status: DC
  Administered 2013-05-07 – 2013-05-08 (×7): 2.5 10*6.[IU] via INTRAVENOUS
  Filled 2013-05-07 (×11): qty 2.5

## 2013-05-07 MED ORDER — FENTANYL 2.5 MCG/ML BUPIVACAINE 1/10 % EPIDURAL INFUSION (WH - ANES)
14.0000 mL/h | INTRAMUSCULAR | Status: DC | PRN
  Administered 2013-05-07: 12 mL/h via EPIDURAL
  Administered 2013-05-08: 14 mL/h via EPIDURAL
  Filled 2013-05-07 (×2): qty 125

## 2013-05-07 MED ORDER — OXYTOCIN 40 UNITS IN LACTATED RINGERS INFUSION - SIMPLE MED
1.0000 m[IU]/min | INTRAVENOUS | Status: DC
  Administered 2013-05-08: 2 m[IU]/min via INTRAVENOUS

## 2013-05-07 MED ORDER — OXYTOCIN BOLUS FROM INFUSION: 500.0000 mL | INTRAVENOUS | Status: DC

## 2013-05-07 MED ORDER — LACTATED RINGERS IV SOLN
500.0000 mL | Freq: Once | INTRAVENOUS | Status: AC
  Administered 2013-05-07: 500 mL via INTRAVENOUS

## 2013-05-07 MED ORDER — PHENYLEPHRINE 40 MCG/ML (10ML) SYRINGE FOR IV PUSH (FOR BLOOD PRESSURE SUPPORT)
80.0000 ug | PREFILLED_SYRINGE | INTRAVENOUS | Status: DC | PRN
  Filled 2013-05-07 (×2): qty 10

## 2013-05-07 MED ORDER — EPHEDRINE 5 MG/ML INJ
10.0000 mg | INTRAVENOUS | Status: AC | PRN
  Administered 2013-05-07 (×2): 10 mg via INTRAVENOUS
  Filled 2013-05-07: qty 4

## 2013-05-07 MED ORDER — OXYTOCIN 40 UNITS IN LACTATED RINGERS INFUSION - SIMPLE MED
62.5000 mL/h | INTRAVENOUS | Status: DC
  Filled 2013-05-07: qty 1000

## 2013-05-07 MED ORDER — IBUPROFEN 600 MG PO TABS
600.0000 mg | ORAL_TABLET | Freq: Four times a day (QID) | ORAL | Status: DC | PRN
  Filled 2013-05-07: qty 1

## 2013-05-07 MED ORDER — OXYTOCIN 40 UNITS IN LACTATED RINGERS INFUSION - SIMPLE MED: 1.0000 m[IU]/min | INTRAVENOUS | Status: DC

## 2013-05-07 MED ORDER — OXYTOCIN 40 UNITS IN LACTATED RINGERS INFUSION - SIMPLE MED
1.0000 m[IU]/min | INTRAVENOUS | Status: DC
  Administered 2013-05-07: 2 m[IU]/min via INTRAVENOUS
  Filled 2013-05-07: qty 1000

## 2013-05-07 MED ORDER — LACTATED RINGERS IV SOLN
500.0000 mL | INTRAVENOUS | Status: DC | PRN
  Administered 2013-05-07: 1000 mL via INTRAVENOUS

## 2013-05-07 MED ORDER — SODIUM BICARBONATE 8.4 % IV SOLN
INTRAVENOUS | Status: DC | PRN
  Administered 2013-05-07: 4 mL via EPIDURAL

## 2013-05-07 MED ORDER — LACTATED RINGERS IV SOLN
INTRAVENOUS | Status: DC
  Administered 2013-05-07: 17:00:00 via INTRAVENOUS

## 2013-05-07 MED ORDER — OXYCODONE-ACETAMINOPHEN 5-325 MG PO TABS
1.0000 | ORAL_TABLET | ORAL | Status: DC | PRN
  Administered 2013-05-08 – 2013-05-09 (×2): 1 via ORAL
  Filled 2013-05-07 (×2): qty 1

## 2013-05-07 MED ORDER — DIPHENHYDRAMINE HCL 50 MG/ML IJ SOLN: 12.5000 mg | INTRAMUSCULAR | Status: DC | PRN

## 2013-05-07 MED ORDER — PHENYLEPHRINE 40 MCG/ML (10ML) SYRINGE FOR IV PUSH (FOR BLOOD PRESSURE SUPPORT)
80.0000 ug | PREFILLED_SYRINGE | INTRAVENOUS | Status: DC | PRN
  Administered 2013-05-08 (×2): 80 ug via INTRAVENOUS
  Filled 2013-05-07: qty 10

## 2013-05-07 MED ORDER — ACETAMINOPHEN 325 MG PO TABS: 650.0000 mg | ORAL_TABLET | ORAL | Status: DC | PRN

## 2013-05-07 MED ORDER — EPHEDRINE 5 MG/ML INJ
10.0000 mg | INTRAVENOUS | Status: DC | PRN
  Filled 2013-05-07 (×2): qty 4

## 2013-05-07 NOTE — Progress Notes (Signed)
Pt reports possible SROM.  Sheets dry underneath pt.  Fern test negative.  Will continue to monitor.

## 2013-05-07 NOTE — H&P (Signed)
[name] is a 42 y.o. female presenting for postdates. Maternal Medical History:  Reason for admission: 42 yo G8 P7.  EDC 05-01-13.  Presents for IOL for postdates.  Fetal activity: Perceived fetal activity is normal.   Last perceived fetal movement was within the past hour.    Prenatal complications: no prenatal complications Prenatal Complications - Diabetes: none.    OB History   Grav Para Term Preterm Abortions TAB SAB Ect Mult Living   8 7 7       7      No past medical history on file. No past surgical history on file. Family History: family history includes Asthma in her father; Diabetes in her maternal grandmother; Hypertension in her maternal grandmother and mother. Social History:  reports that she has never smoked. She has never used smokeless tobacco. She reports that she does not drink alcohol or use illicit drugs.   Prenatal Transfer Tool  Maternal Diabetes: No Genetic Screening: Declined Maternal Ultrasounds/Referrals: Normal Fetal Ultrasounds or other Referrals:  None Maternal Substance Abuse:  No Significant Maternal Medications:  None Significant Maternal Lab Results:  None Other Comments:  None  Review of Systems  All other systems reviewed and are negative.    Dilation: 4 Effacement (%): 100 Station: -2 Exam by:: S Earl RN Blood pressure 101/38, pulse 67, temperature 98 F (36.7 C), temperature source Oral, resp. rate 18, height 5' (1.524 m), weight 149 lb (67.586 kg), last menstrual period 08/11/2012. Exam Physical Exam  Nursing note and vitals reviewed. Constitutional: She is oriented to person, place, and time. She appears well-developed and well-nourished.  HENT:  Head: Normocephalic and atraumatic.  Eyes: Conjunctivae are normal. Pupils are equal, round, and reactive to light.  Neck: Normal range of motion. Neck supple.  Cardiovascular: Normal rate and regular rhythm.   Respiratory: Effort normal.  GI: Soft.  Musculoskeletal: Normal  range of motion.  Neurological: She is alert and oriented to person, place, and time.  Skin: Skin is warm and dry.  Psychiatric: She has a normal mood and affect. Her behavior is normal. Judgment and thought content normal.    Prenatal labs: ABO, Rh: B/POS/-- (05/16 1037) Antibody: NEG (05/16 1037) Rubella: 6.76 (05/16 1037) RPR: NON REAC (09/17 0956)  HBsAg: NEGATIVE (05/16 1037)  HIV: NON REACTIVE (09/17 0956)  GBS: Positive (09/21 0000)   Assessment/Plan: 40.6 weeks.  AMA.  IOL.   [name] A 05/07/2013, 4:21 PM

## 2013-05-07 NOTE — Progress Notes (Signed)
Encouraged pt to change positions.  Offered bedside rocking chair.  Pt declines at this time.

## 2013-05-07 NOTE — Anesthesia Preprocedure Evaluation (Signed)

## 2013-05-07 NOTE — Progress Notes (Signed)
Yola Paradiso is a 42 y.o. [id number] at [redacted]w[redacted]d by LMP admitted for induction of labor due to Post dates. Due date 05-01-13.  Subjective:   Objective: BP 101/38  Pulse 67  Temp(Src) 98 F (36.7 C) (Oral)  Resp 18  Ht 5' (1.524 m)  Wt 149 lb (67.586 kg)  BMI 29.10 kg/m2  LMP 08/11/2012      FHT:  FHR: 140-150 bpm, variability: moderate,  accelerations:  Present,  decelerations:  Absent UC:   regular, every 3-5 minutes SVE:   Dilation: 4 Effacement (%): 100 Station: -2 Exam by:: [employer] RN  Labs: Lab Results  Component Value Date   WBC 6.1 05/07/2013   HGB 13.7 05/07/2013   HCT 38.8 05/07/2013   MCV 85.7 05/07/2013   PLT 175 05/07/2013    Assessment / Plan: Induction of labor due to postdates,  progressing well on pitocin  Labor: Progressing normally Preeclampsia:  n/a Fetal Wellbeing:  Category I Pain Control:  Labor support without medications I/D:  n/a Anticipated MOD:  NSVD  HARPER,CHARLES A 05/07/2013, 4:28 PM

## 2013-05-07 NOTE — Anesthesia Procedure Notes (Signed)
Epidural Patient location during procedure: OB  Preanesthetic Checklist Completed: patient identified, site marked, surgical consent, pre-op evaluation, timeout performed, IV checked, risks and benefits discussed and monitors and equipment checked  Epidural Patient position: sitting Prep: site prepped and draped and DuraPrep Patient monitoring: continuous pulse ox and blood pressure Approach: midline Injection technique: LOR air  Needle:  Needle type: Tuohy  Needle gauge: 17 G Needle length: 9 cm and 9 Needle insertion depth: 4 cm Catheter type: closed end flexible Catheter size: 19 Gauge Catheter at skin depth: 10 cm Test dose: negative  Assessment Events: blood not aspirated, injection not painful, no injection resistance, negative IV test and no paresthesia  Additional Notes Dosing of Epidural:  1st dose, through catheter ............................................Marland Kitchen epi 1:200K + Xylocaine 40 mg  2nd dose, through catheter, after waiting 3 minutes...Marland KitchenMarland Kitchenepi 1:200K + Xylocaine 40 mg   ( 2% Xylo charted as a single dose in Epic Meds for ease of charting; actual dosing was fractionated as above, for saftey's sake)  As each dose occurred, patient was free of IV sx; and patient exhibited no evidence of SA injection.  Patient is more comfortable after epidural dosed. Please see RN's note for documentation of vital signs,and FHR which are stable.  Patient reminded not to try to ambulate with numb legs, and that an RN must be present the 1st time she attempts to get up.

## 2013-05-08 ENCOUNTER — Encounter (HOSPITAL_COMMUNITY)
Admission: RE | Disposition: A | Discharge status: Home / Self Care | Payer: Self-pay | Source: Ambulatory Visit | Attending: Obstetrics & Gynecology

## 2013-05-08 ENCOUNTER — Encounter (HOSPITAL_COMMUNITY): Payer: Medicaid Other | Admitting: Anesthesiology

## 2013-05-08 ENCOUNTER — Encounter (HOSPITAL_COMMUNITY): Payer: Self-pay

## 2013-05-08 ENCOUNTER — Inpatient Hospital Stay (HOSPITAL_COMMUNITY): Payer: Medicaid Other

## 2013-05-08 ENCOUNTER — Inpatient Hospital Stay (HOSPITAL_COMMUNITY): Payer: Medicaid Other | Admitting: Anesthesiology

## 2013-05-08 DIAGNOSIS — O34219 Maternal care for unspecified type scar from previous cesarean delivery: Secondary | ICD-10-CM | POA: Insufficient documentation

## 2013-05-08 HISTORY — PX: DILATION AND EVACUATION: SHX1459

## 2013-05-08 LAB — CBC
HCT: 26.9 % — ABNORMAL LOW (ref 36.0–46.0)
Hemoglobin: 9.4 g/dL — ABNORMAL LOW (ref 12.0–15.0)
MCH: 29.7 pg (ref 26.0–34.0)
MCHC: 34.9 g/dL (ref 30.0–36.0)
MCV: 85.1 fL (ref 78.0–100.0)
Platelets: 170 10*3/uL (ref 150–400)
RBC: 3.16 MIL/uL — ABNORMAL LOW (ref 3.87–5.11)
RDW: 13.3 % (ref 11.5–15.5)
WBC: 18.1 10*3/uL — ABNORMAL HIGH (ref 4.0–10.5)

## 2013-05-08 LAB — DIC (DISSEMINATED INTRAVASCULAR COAGULATION)PANEL
D-Dimer, Quant: 2.64 ug/mL-FEU — ABNORMAL HIGH (ref 0.00–0.48)
Fibrinogen: 324 mg/dL (ref 204–475)
INR: 1.16 (ref 0.00–1.49)
Platelets: 155 10*3/uL (ref 150–400)
Prothrombin Time: 14.6 seconds (ref 11.6–15.2)

## 2013-05-08 LAB — DIC (DISSEMINATED INTRAVASCULAR COAGULATION) PANEL: aPTT: 25 seconds (ref 24–37)

## 2013-05-08 LAB — SAVE SMEAR

## 2013-05-08 LAB — PREPARE RBC (CROSSMATCH)

## 2013-05-08 LAB — POSTPARTUM HEMORRHAGE PROTOCOL (BB NOTIFICATION)

## 2013-05-08 SURGERY — DILATION AND EVACUATION, UTERUS
Anesthesia: Epidural | Site: Vagina

## 2013-05-08 MED ORDER — OXYCODONE-ACETAMINOPHEN 5-325 MG PO TABS: 1.0000 | ORAL_TABLET | ORAL | Status: DC | PRN

## 2013-05-08 MED ORDER — FERROUS SULFATE 325 (65 FE) MG PO TABS: 325.0000 mg | ORAL_TABLET | Freq: Two times a day (BID) | ORAL | Status: DC

## 2013-05-08 MED ORDER — MISOPROSTOL 200 MCG PO TABS: 800.0000 ug | ORAL_TABLET | Freq: Once | ORAL | Status: DC

## 2013-05-08 MED ORDER — ONDANSETRON HCL 4 MG PO TABS: 4.0000 mg | ORAL_TABLET | ORAL | Status: DC | PRN

## 2013-05-08 MED ORDER — PRENATAL MULTIVITAMIN CH: 1.0000 | ORAL_TABLET | Freq: Every day | ORAL | Status: DC

## 2013-05-08 MED ORDER — MEPERIDINE HCL 25 MG/ML IJ SOLN
INTRAMUSCULAR | Status: DC | PRN
  Administered 2013-05-08: 12.5 mg via INTRAVENOUS

## 2013-05-08 MED ORDER — LIDOCAINE HCL 2 % IJ SOLN
INTRAMUSCULAR | Status: AC
  Filled 2013-05-08: qty 20

## 2013-05-08 MED ORDER — FENTANYL CITRATE 0.05 MG/ML IJ SOLN: 25.0000 ug | INTRAMUSCULAR | Status: DC | PRN

## 2013-05-08 MED ORDER — MAGNESIUM HYDROXIDE 400 MG/5ML PO SUSP
30.0000 mL | ORAL | Status: DC | PRN
  Filled 2013-05-08: qty 30

## 2013-05-08 MED ORDER — TETANUS-DIPHTH-ACELL PERTUSSIS 5-2.5-18.5 LF-MCG/0.5 IM SUSP
0.5000 mL | Freq: Once | INTRAMUSCULAR | Status: DC
  Filled 2013-05-08: qty 0.5

## 2013-05-08 MED ORDER — PRENATAL MULTIVITAMIN CH
1.0000 | ORAL_TABLET | Freq: Every day | ORAL | Status: DC
  Administered 2013-05-09: 1 via ORAL
  Filled 2013-05-08: qty 1

## 2013-05-08 MED ORDER — SENNOSIDES-DOCUSATE SODIUM 8.6-50 MG PO TABS: 2.0000 | ORAL_TABLET | ORAL | Status: DC

## 2013-05-08 MED ORDER — CEFAZOLIN SODIUM-DEXTROSE 2-3 GM-% IV SOLR
INTRAVENOUS | Status: AC
  Filled 2013-05-08: qty 50

## 2013-05-08 MED ORDER — ZOLPIDEM TARTRATE 5 MG PO TABS: 5.0000 mg | ORAL_TABLET | Freq: Every evening | ORAL | Status: DC | PRN

## 2013-05-08 MED ORDER — FENTANYL CITRATE 0.05 MG/ML IJ SOLN
INTRAMUSCULAR | Status: AC
  Filled 2013-05-08: qty 5

## 2013-05-08 MED ORDER — CARBOPROST TROMETHAMINE 250 MCG/ML IM SOLN: 250.0000 ug | INTRAMUSCULAR | Status: DC | PRN

## 2013-05-08 MED ORDER — TETANUS-DIPHTH-ACELL PERTUSSIS 5-2.5-18.5 LF-MCG/0.5 IM SUSP: 0.5000 mL | Freq: Once | INTRAMUSCULAR | Status: DC

## 2013-05-08 MED ORDER — LACTATED RINGERS IV SOLN
INTRAVENOUS | Status: DC | PRN
  Administered 2013-05-08: 15:00:00 via INTRAVENOUS

## 2013-05-08 MED ORDER — OXYTOCIN 40 UNITS IN LACTATED RINGERS INFUSION - SIMPLE MED: 62.5000 mL/h | INTRAVENOUS | Status: DC | PRN

## 2013-05-08 MED ORDER — PHENYLEPHRINE HCL 10 MG/ML IJ SOLN
20.0000 mg | INTRAVENOUS | Status: DC | PRN
  Administered 2013-05-08: 60 ug/min via INTRAVENOUS

## 2013-05-08 MED ORDER — MEASLES, MUMPS & RUBELLA VAC ~~LOC~~ INJ: 0.5000 mL | INJECTION | Freq: Once | SUBCUTANEOUS | Status: DC

## 2013-05-08 MED ORDER — METHYLERGONOVINE MALEATE 0.2 MG PO TABS
0.2000 mg | ORAL_TABLET | ORAL | Status: AC
  Administered 2013-05-08 – 2013-05-09 (×6): 0.2 mg via ORAL
  Filled 2013-05-08 (×6): qty 1

## 2013-05-08 MED ORDER — METHYLERGONOVINE MALEATE 0.2 MG/ML IJ SOLN
0.2000 mg | Freq: Once | INTRAMUSCULAR | Status: AC
  Administered 2013-05-08: 0.2 mg via INTRAMUSCULAR

## 2013-05-08 MED ORDER — DIPHENHYDRAMINE HCL 25 MG PO CAPS: 25.0000 mg | ORAL_CAPSULE | Freq: Four times a day (QID) | ORAL | Status: DC | PRN

## 2013-05-08 MED ORDER — WITCH HAZEL-GLYCERIN EX PADS: 1.0000 "application " | MEDICATED_PAD | CUTANEOUS | Status: DC | PRN

## 2013-05-08 MED ORDER — SIMETHICONE 80 MG PO CHEW: 80.0000 mg | CHEWABLE_TABLET | ORAL | Status: DC | PRN

## 2013-05-08 MED ORDER — PROMETHAZINE HCL 25 MG/ML IJ SOLN: 6.2500 mg | INTRAMUSCULAR | Status: DC | PRN

## 2013-05-08 MED ORDER — MIDAZOLAM HCL 2 MG/2ML IJ SOLN
INTRAMUSCULAR | Status: AC
  Filled 2013-05-08: qty 2

## 2013-05-08 MED ORDER — KETOROLAC TROMETHAMINE 30 MG/ML IJ SOLN
INTRAMUSCULAR | Status: AC
  Filled 2013-05-08: qty 1

## 2013-05-08 MED ORDER — MEPERIDINE HCL 25 MG/ML IJ SOLN: 6.2500 mg | INTRAMUSCULAR | Status: DC | PRN

## 2013-05-08 MED ORDER — IBUPROFEN 600 MG PO TABS
600.0000 mg | ORAL_TABLET | Freq: Four times a day (QID) | ORAL | Status: DC
  Administered 2013-05-08 – 2013-05-10 (×6): 600 mg via ORAL
  Filled 2013-05-08 (×6): qty 1

## 2013-05-08 MED ORDER — METHYLERGONOVINE MALEATE 0.2 MG/ML IJ SOLN
INTRAMUSCULAR | Status: AC
  Filled 2013-05-08: qty 1

## 2013-05-08 MED ORDER — METHYLERGONOVINE MALEATE 0.2 MG/ML IJ SOLN
INTRAMUSCULAR | Status: AC
  Administered 2013-05-08: 0.2 mg via INTRAMUSCULAR
  Filled 2013-05-08: qty 1

## 2013-05-08 MED ORDER — DIBUCAINE 1 % RE OINT: 1.0000 "application " | TOPICAL_OINTMENT | RECTAL | Status: DC | PRN

## 2013-05-08 MED ORDER — KETOROLAC TROMETHAMINE 30 MG/ML IJ SOLN
15.0000 mg | Freq: Once | INTRAMUSCULAR | Status: AC | PRN
  Administered 2013-05-08: 30 mg via INTRAVENOUS

## 2013-05-08 MED ORDER — ONDANSETRON HCL 4 MG/2ML IJ SOLN: 4.0000 mg | INTRAMUSCULAR | Status: DC | PRN

## 2013-05-08 MED ORDER — MEPERIDINE HCL 25 MG/ML IJ SOLN
INTRAMUSCULAR | Status: AC
  Filled 2013-05-08: qty 1

## 2013-05-08 MED ORDER — PROPOFOL 10 MG/ML IV EMUL
INTRAVENOUS | Status: AC
  Filled 2013-05-08: qty 20

## 2013-05-08 MED ORDER — CEFAZOLIN SODIUM-DEXTROSE 2-3 GM-% IV SOLR
INTRAVENOUS | Status: DC | PRN
  Administered 2013-05-08: 2 g via INTRAVENOUS

## 2013-05-08 MED ORDER — FERROUS SULFATE 325 (65 FE) MG PO TABS
325.0000 mg | ORAL_TABLET | Freq: Two times a day (BID) | ORAL | Status: DC
  Administered 2013-05-09 (×2): 325 mg via ORAL
  Filled 2013-05-08 (×2): qty 1

## 2013-05-08 MED ORDER — BENZOCAINE-MENTHOL 20-0.5 % EX AERO: 1.0000 "application " | INHALATION_SPRAY | CUTANEOUS | Status: DC | PRN

## 2013-05-08 MED ORDER — LANOLIN HYDROUS EX OINT: TOPICAL_OINTMENT | CUTANEOUS | Status: DC | PRN

## 2013-05-08 MED ORDER — METHYLERGONOVINE MALEATE 0.2 MG/ML IJ SOLN: 0.2000 mg | INTRAMUSCULAR | Status: AC

## 2013-05-08 MED ORDER — LIDOCAINE HCL (CARDIAC) 20 MG/ML IV SOLN
INTRAVENOUS | Status: AC
  Filled 2013-05-08: qty 5

## 2013-05-08 MED ORDER — SENNOSIDES-DOCUSATE SODIUM 8.6-50 MG PO TABS
2.0000 | ORAL_TABLET | ORAL | Status: DC
  Administered 2013-05-08: 2 via ORAL
  Filled 2013-05-08: qty 2

## 2013-05-08 MED ORDER — MIDAZOLAM HCL 2 MG/2ML IJ SOLN: 0.5000 mg | Freq: Once | INTRAMUSCULAR | Status: DC | PRN

## 2013-05-08 MED ORDER — METHYLERGONOVINE MALEATE 0.2 MG/ML IJ SOLN
INTRAMUSCULAR | Status: DC | PRN
  Administered 2013-05-08: 0.2 mg via INTRAMUSCULAR

## 2013-05-08 MED ORDER — OXYTOCIN 40 UNITS IN LACTATED RINGERS INFUSION - SIMPLE MED: 250.0000 mL/h | INTRAVENOUS | Status: DC

## 2013-05-08 SURGICAL SUPPLY — 20 items
CATH ROBINSON RED A/P 16FR (CATHETERS) ×2 IMPLANT
CLOTH BEACON ORANGE TIMEOUT ST (SAFETY) ×2 IMPLANT
DECANTER SPIKE VIAL GLASS SM (MISCELLANEOUS) ×2 IMPLANT
GLOVE BIO SURGEON STRL SZ8.5 (GLOVE) ×2 IMPLANT
GOWN PREVENTION PLUS XXLARGE (GOWN DISPOSABLE) ×2 IMPLANT
GOWN STRL REIN XL XLG (GOWN DISPOSABLE) ×4 IMPLANT
KIT BERKELEY 1ST TRIMESTER 3/8 (MISCELLANEOUS) ×2 IMPLANT
NEEDLE SPNL 22GX3.5 QUINCKE BK (NEEDLE) ×2 IMPLANT
NS IRRIG 1000ML POUR BTL (IV SOLUTION) ×2 IMPLANT
PACK VAGINAL MINOR WOMEN LF (CUSTOM PROCEDURE TRAY) ×2 IMPLANT
PAD OB MATERNITY 4.3X12.25 (PERSONAL CARE ITEMS) ×2 IMPLANT
PAD PREP 24X48 CUFFED NSTRL (MISCELLANEOUS) ×2 IMPLANT
SET BERKELEY SUCTION TUBING (SUCTIONS) ×2 IMPLANT
SYR CONTROL 10ML LL (SYRINGE) ×2 IMPLANT
TOWEL OR 17X24 6PK STRL BLUE (TOWEL DISPOSABLE) ×4 IMPLANT
VACURETTE 10 RIGID CVD (CANNULA) IMPLANT
VACURETTE 12 RIGID CVD (CANNULA) ×2 IMPLANT
VACURETTE 7MM CVD STRL WRAP (CANNULA) IMPLANT
VACURETTE 8 RIGID CVD (CANNULA) IMPLANT
VACURETTE 9 RIGID CVD (CANNULA) IMPLANT

## 2013-05-08 NOTE — Anesthesia Preprocedure Evaluation (Addendum)
Anesthesia Evaluation  Patient identified by MRN, date of birth, ID band Patient awake    Reviewed: Allergy & Precautions, H&P , Patient's Chart, lab work & pertinent test results  Airway Mallampati: II TM Distance: >3 FB Neck ROM: full    Dental  (+) Teeth Intact   Pulmonary  breath sounds clear to auscultation        Cardiovascular Rhythm:regular Rate:Normal     Neuro/Psych    GI/Hepatic   Endo/Other    Renal/GU      Musculoskeletal   Abdominal   Peds  Hematology   Anesthesia Other Findings       Reproductive/Obstetrics                          Anesthesia Physical  Anesthesia Plan  ASA: II and emergent  Anesthesia Plan: Epidural   Post-op Pain Management:    Induction:   Airway Management Planned:   Additional Equipment:   Intra-op Plan:   Post-operative Plan:   Informed Consent: I have reviewed the patients History and Physical, chart, labs and discussed the procedure including the risks, benefits and alternatives for the proposed anesthesia with the patient or authorized representative who has indicated his/her understanding and acceptance.   Dental Advisory Given  Plan Discussed with:   Anesthesia Plan Comments: (Labs checked- platelets confirmed with RN in room. Fetal heart tracing, per RN, reported to be stable enough for sitting procedure. Discussed epidural, and patient consents to the procedure:  included risk of possible headache,backache, failed block, allergic reaction, and nerve injury. This patient was asked if she had any questions or concerns before the procedure started. )       Anesthesia Quick Evaluation

## 2013-05-08 NOTE — Progress Notes (Signed)
05/08/13 1530  Clinical Encounter Type  Visited With Family;Patient not available (pt in OR)  Visit Type (postpartum hemorrhage)  Referral From Nurse Marisue Humble, RN, from birthing suites)  Spiritual Encounters  Spiritual Needs Emotional  Stress Factors  Family Stress Factors Health changes;Loss of control (sister reported experiencing similar problem years ago)   Family (sister, mother, husband, daughter, etc.) was coping well during the wait while pt was in the OR to control bleeding.  They welcome updates and were waiting in first floor vending when I last saw them.  Family is originally from [place] and appears to have local support/friend network.  Please page chaplain at [phone] if further support needed.  [address] [place], [place] [phone]

## 2013-05-08 NOTE — Progress Notes (Signed)
Called into room by pt stating she is "pushing", when pulling back covers notice a lg amount of blood and several clots on towel.  Pt's abdomen feels soft.  FHR tracing is reassuring.  SVE done.  Epidural PCA button hit.  Will continue to monitor bleeding closely.

## 2013-05-08 NOTE — H&P (Signed)
As an addendum history and physical patient delivered a 12:58 PM and the placenta was spontaneous and the placenta was spontaneous intact unknown liters should both 1000 cc and there is some trailing membranes removed and on examination that she had some retained tissue so patient to be taken back to the operating room for a D&C E. Physical exam well-developed female postpartum stable vital signs normal Lungs clear to P&A Heart regular rhythm no murmurs no gallops Uterus 18-20 week postpartum size Vagina external genitalia normal

## 2013-05-08 NOTE — Transfer of Care (Signed)
Immediate Anesthesia Transfer of Care Note  Patient: Lasandra Hilliker  Procedure(s) Performed: Procedure(s): DILATATION AND EVACUATION (N/A)  Patient Location: PACU  Anesthesia Type:Epidural  Level of Consciousness: awake, alert  and oriented  Airway & Oxygen Therapy: Patient Spontanous Breathing and Patient connected to nasal cannula oxygen  Post-op Assessment: Report given to PACU RN and Post -op Vital signs reviewed and unstable, Anesthesiologist notified  Post vital signs: Reviewed and unstable, blood pressure 87/44. LR 1000 cc bolus given, Dr. Tamela Oddi notified, orders to transfuse PRBC's given at 1550.  Complications: No apparent anesthesia complications

## 2013-05-08 NOTE — Anesthesia Postprocedure Evaluation (Signed)
  Anesthesia Post Note  Patient: [name]  Procedure(s) Performed: Procedure(s) (LRB): DILATATION AND EVACUATION (N/A)  Anesthesia type: Epidural  Patient location: PACU  Post pain: Pain level controlled  Post assessment: Post-op Vital signs reviewed  Last Vitals:  Filed Vitals:   05/08/13 1803  BP: 111/59  Pulse: 65  Temp: 36.9 C  Resp:     Post vital signs: Reviewed  Level of consciousness: awake  Complications: No apparent anesthesia complications

## 2013-05-08 NOTE — Progress Notes (Addendum)
1410 called for Anesthesia MD and staff assist for PPH. House coverage also called. Unresponsive to sternal rub after MD dosed pt via epidural for OR. See B/P and MAR. Dr Gaynell Face notified and came to room. Fundus remain firm but able to express large amount of clots and blood. OR notified. Fluid bolus started per Dr Sheral Apley. Pt responded to MD's commands to open eyes and squeeze hand.

## 2013-05-08 NOTE — Progress Notes (Signed)
Patient ID: [name], female   DOB: 1971/01/22, 42 y.o.   MRN: [id number] Called because him on all after delivered patient had excessive vaginal bleeding and some membranes were removed and examination suggested retained tissue dictating the operative room for a D&E

## 2013-05-08 NOTE — Op Note (Signed)
Preoperative diagnosis: Retained placental fragment with hemorrhage   Postoperative diagnosis: Same, disrupted cesarean delivery scar  Procedure: Suction dilatation and curettage with ultrasound guidance Surgeon: Antionette Char A  Anesthesia:epidural  Estimated blood loss: 300 ml  Urine output: per Anesthesiology  IV Fluids: per Anesthesiology  Complications: The cesarean section scar was disrupted during manual exploration of the uterus  Specimen: PATHOLOGY  Operative Findings: A membrane fragment was removed from the lower uterine segment.   Description of procedure:   The patient was taken to the operating room and placed on the operating table in the semi-lithotomy position in [place] stirrups.  Examination under anesthesia was performed.  The patient was prepped and draped in the usual manner.  After a time-out had been completed, a speculum was placed in the vagina.  The anterior lip of the cervix was grasped with an empty ring forceps. A 12 -mm suction curet was inserted in the uterine cavity under ultrasound guidance.  The device was activated and the curet rotated to evacuate the products of conception.  The uterus was manually explored to ensure complete evacuation and the prior scar was entered.   All the instruments were removed from the vagina.  Utero tonics were administered and fundal massage was performed.  The fundus was firm and there was scant vaginal bleeding.   Final instrument counts were correct.  The patient was taken to the PACU in stable condition.

## 2013-05-08 NOTE — Progress Notes (Signed)
Tereasa Yilmaz is a 42 y.o. Z6X0960 at [redacted]w[redacted]d by LMP admitted for IOL for postdates.  VBAC.  Subjective:   Objective: BP 116/63  Pulse 77  Temp(Src) 98.4 F (36.9 C) (Oral)  Resp 18  Ht 5' (1.524 m)  Wt 149 lb (67.586 kg)  BMI 29.10 kg/m2  SpO2 100%  LMP 08/11/2012      FHT:  FHR: 140-150- bpm, variability: minimal ,  accelerations:  Present,  decelerations:  Absent UC:   regular, every 3 minutes SVE:   Dilation: 7 Effacement (%): 100 Station: -1 Exam by:: B.Cagna,RN  Labs: Lab Results  Component Value Date   WBC 6.1 05/07/2013   HGB 13.7 05/07/2013   HCT 38.8 05/07/2013   MCV 85.7 05/07/2013   PLT 175 05/07/2013    Assessment / Plan: Slow progress.  Monitor closely.   Man Bonneau A 05/08/2013, 6:09 AM

## 2013-05-08 NOTE — Progress Notes (Signed)
Pt arrives from or alert and oriented, pressures low in the 80's with a map in the 40's, fluid increased and Dr Tamela Oddi made aware, order received for transfusion of  2 units of blood, pt also complaining of slight pressure in chest, no other cardiac symptoms present, sats 98% and pt moving lower extremities.  Dr. Tamela Oddi made aware and  12 lead EKG ordered.  O2 applied at 2L per [place].  Will continue to monitor and manage.  Will admit to ICU per MD

## 2013-05-08 NOTE — OR Nursing (Signed)
Due to an emergent situation there was not a signed consent for this patient.

## 2013-05-08 NOTE — Progress Notes (Signed)
1415 2nd IV started per MD request with fluid bolus and medication administration.

## 2013-05-08 NOTE — H&P (Signed)
Patient had a normal vaginal delivery 1258 placenta was spontaneous what she continuous the bleeding she as possible 2000 cc of clots there was some membranes noted and this removed and on exam it appears that she's got some retained tissue so she did taken to the operating room for a D&E her bile signs normal Physical exam well-developed female in no acute distress normal vital signs Lungs clear to P&A Heart regular rhythm no murmurs no gallops Breasts engorged Abdomen uterus 18 week size postpartum firm Vagina external genitalia normal Extremities negative and

## 2013-05-08 NOTE — Progress Notes (Signed)
Barbra Miner is a 42 y.o. Z6X0960 at [redacted]w[redacted]d by LMP admitted for IOL for postdates.  VBAC,  Subjective:   Objective: BP 106/54  Pulse 83  Temp(Src) 98.2 F (36.8 C) (Oral)  Resp 18  Ht 5' (1.524 m)  Wt 149 lb (67.586 kg)  BMI 29.10 kg/m2  SpO2 100%  LMP 08/11/2012      FHT:  150-160bpm UC:   irregular, every 3-5 minutes SVE:   Dilation: 5.5 Effacement (%): 100 Station: -2 Exam by:: B.Cagna,RN AROM:  Clear fluid.  IUPC placed.  Labs: Lab Results  Component Value Date   WBC 6.1 05/07/2013   HGB 13.7 05/07/2013   HCT 38.8 05/07/2013   MCV 85.7 05/07/2013   PLT 175 05/07/2013    Assessment / Plan: Protracted active phase.  Increase pitocin.  Monitor closely.  Labor: Protracted active phase.   Preeclampsia:  n/a Fetal Wellbeing:  Category I Pain Control:  Epidural I/D:  n/a Anticipated MOD:  NSVD  Rorie Delmore A 05/08/2013, 5:21 AM

## 2013-05-09 LAB — CBC
HCT: 30 % — ABNORMAL LOW (ref 36.0–46.0)
Hemoglobin: 11 g/dL — ABNORMAL LOW (ref 12.0–15.0)
MCH: 31 pg (ref 26.0–34.0)
MCHC: 36.7 g/dL — ABNORMAL HIGH (ref 30.0–36.0)
Platelets: 119 10*3/uL — ABNORMAL LOW (ref 150–400)
RDW: 13.4 % (ref 11.5–15.5)

## 2013-05-09 MED ORDER — LACTATED RINGERS IV SOLN
INTRAVENOUS | Status: DC
  Administered 2013-05-09 (×2): via INTRAVENOUS

## 2013-05-09 NOTE — Anesthesia Postprocedure Evaluation (Signed)
Anesthesia Post Note  Patient: [name]  Procedure(s) Performed: * No procedures listed *  Anesthesia type: Epidural  Patient location: Mother/Baby  Post pain: Pain level controlled  Post assessment: Post-op Vital signs reviewed  Last Vitals:  Filed Vitals:   05/09/13 0700  BP: 97/56  Pulse: 66  Temp:   Resp:     Post vital signs: Reviewed  Level of consciousness: awake  Complications: No apparent anesthesia complications

## 2013-05-09 NOTE — Progress Notes (Signed)
Lying down

## 2013-05-09 NOTE — Progress Notes (Signed)
standing

## 2013-05-09 NOTE — Progress Notes (Signed)
sitting

## 2013-05-09 NOTE — Progress Notes (Signed)
Patient ID: [name], female   DOB: 02/01/1971, 42 y.o.   MRN: [id number] Postpartum day one Vital signs normal  lochia moderate Legs negative Output the bed no complaints with transfer after 24 hours

## 2013-05-10 LAB — CBC
HCT: 27.4 % — ABNORMAL LOW (ref 36.0–46.0)
MCH: 30.2 pg (ref 26.0–34.0)
MCHC: 34.3 g/dL (ref 30.0–36.0)
MCV: 88.1 fL (ref 78.0–100.0)
Platelets: 134 10*3/uL — ABNORMAL LOW (ref 150–400)
RDW: 13.6 % (ref 11.5–15.5)
WBC: 14 10*3/uL — ABNORMAL HIGH (ref 4.0–10.5)

## 2013-05-10 NOTE — Progress Notes (Signed)
Patient ID: [name], female   DOB: March 01, 1971, 42 y.o.   MRN: [id number] Postpartum day 2 Vital signs normal Fundus firm Lochia moderate Legs negative Up and About no complaints home today and

## 2013-05-10 NOTE — Discharge Summary (Signed)
Obstetric Discharge Summary Reason for Admission: induction of labor Prenatal Procedures: none Intrapartum Procedures: spontaneous vaginal delivery Postpartum Procedures: none Complications-Operative and Postpartum: hemorrhage Hemoglobin  Date Value Range Status  05/10/2013 9.4* 12.0 - 15.0 g/dL Final     HCT  Date Value Range Status  05/10/2013 27.4* 36.0 - 46.0 % Final    Physical Exam:  General: alert Lochia: appropriate Uterine Fundus: firm Incision: healing well DVT Evaluation: No evidence of DVT seen on physical exam.  Discharge Diagnoses: Term Pregnancy-delivered  Discharge Information: Date: 05/10/2013 Activity: pelvic rest Diet: routine Medications: Percocet Condition: stable Instructions: refer to practice specific booklet Discharge to: home Follow-up Information   Follow up with HARPER,CHARLES A, MD.   Specialty:  Obstetrics and Gynecology   Contact information:   [address] [place] [address] [phone]       Newborn Data: Live born female  Birth Weight: 7 lb 12.9 oz (3540 g) APGAR: 7, 9  Home with mother.  Vencil Basnett A 05/10/2013, 7:09 AM

## 2013-05-11 ENCOUNTER — Encounter (HOSPITAL_COMMUNITY): Payer: Self-pay | Admitting: Obstetrics & Gynecology

## 2013-05-11 LAB — TYPE AND SCREEN
ABO/RH(D): B POS
Antibody Screen: NEGATIVE
Unit division: 0

## 2013-05-11 NOTE — Progress Notes (Signed)
Ur chart review completed.  

## 2013-05-21 ENCOUNTER — Ambulatory Visit (INDEPENDENT_AMBULATORY_CARE_PROVIDER_SITE_OTHER): Payer: Medicaid Other | Admitting: Obstetrics & Gynecology

## 2013-05-21 ENCOUNTER — Encounter: Payer: Self-pay | Admitting: Obstetrics & Gynecology

## 2013-05-21 MED ORDER — VITAFOL ULTRA 29-0.6-0.4-200 MG PO CAPS: 1.0000 | ORAL_CAPSULE | Freq: Every day | ORAL | Status: DC

## 2013-05-21 NOTE — Progress Notes (Signed)
Subjective:     [name] is a 42 y.o. female who presents for a postpartum visit. She is 2 weeks postpartum following a spontaneous vaginal delivery. I have fully reviewed the prenatal and intrapartum course. The delivery was at 41 gestational weeks. Outcome: spontaneous vaginal delivery. Anesthesia: epidural. Postpartum course has been normal. Baby's course has been normal. Baby is feeding by both breast and bottle - gerber. Bleeding red and sometimes dark. Bowel function is normal. Bladder function is normal. Patient is sexually active. Contraception method is none. Postpartum depression screening: negative.  The following portions of the patient's history were reviewed and updated as appropriate: allergies, current medications, past family history, past medical history, past social history, past surgical history and problem list.  Review of Systems Pertinent items are noted in HPI.   Objective:    BP 110/72  Pulse 76  Temp(Src) 98.1 F (36.7 C)  Ht 5' (1.524 m)  Wt 132 lb (59.875 kg)  BMI 25.78 kg/m2  LMP 08/11/2012       No exam today Assessment:  Intraoperative events/findings reviewed from the [facility] Counseled that she is not a candidate for future TOLAC  Plan:    Follow up  or as needed.

## 2013-05-24 NOTE — Patient Instructions (Signed)
Contraception Choices Contraception (birth control) is the use of any methods or devices to prevent pregnancy. Below are some methods to help avoid pregnancy. HORMONAL METHODS   Contraceptive implant This is a thin, plastic tube containing progesterone hormone. It does not contain estrogen hormone. Your health care provider inserts the tube in the inner part of the upper arm. The tube can remain in place for up to 3 years. After 3 years, the implant must be removed. The implant prevents the ovaries from releasing an egg (ovulation), thickens the cervical mucus to prevent sperm from entering the uterus, and thins the lining of the inside of the uterus.  Progesterone-only injections These injections are given every 3 months by your health care provider to prevent pregnancy. This synthetic progesterone hormone stops the ovaries from releasing eggs. It also thickens cervical mucus and changes the uterine lining. This makes it harder for sperm to survive in the uterus.  Birth control pills These pills contain estrogen and progesterone hormone. They work by preventing the ovaries from releasing eggs (ovulation). They also cause the cervical mucus to thicken, preventing the sperm from entering the uterus. Birth control pills are prescribed by a health care provider.Birth control pills can also be used to treat heavy periods.  Minipill This type of birth control pill contains only the progesterone hormone. They are taken every day of each month and must be prescribed by your health care provider.  Birth control patch The patch contains hormones similar to those in birth control pills. It must be changed once a week and is prescribed by a health care provider.  Vaginal ring The ring contains hormones similar to those in birth control pills. It is left in the vagina for 3 weeks, removed for 1 week, and then a new one is put back in place. The patient must be comfortable inserting and removing the ring from the  vagina.A health care provider's prescription is necessary.  Emergency contraception Emergency contraceptives prevent pregnancy after unprotected sexual intercourse. This pill can be taken right after sex or up to 5 days after unprotected sex. It is most effective the sooner you take the pills after having sexual intercourse. Most emergency contraceptive pills are available without a prescription. Check with your pharmacist. Do not use emergency contraception as your only form of birth control. BARRIER METHODS   Female condom This is a thin sheath (latex or rubber) that is worn over the penis during sexual intercourse. It can be used with spermicide to increase effectiveness.  Female condom. This is a soft, loose-fitting sheath that is put into the vagina before sexual intercourse.  Diaphragm This is a soft, latex, dome-shaped barrier that must be fitted by a health care provider. It is inserted into the vagina, along with a spermicidal jelly. It is inserted before intercourse. The diaphragm should be left in the vagina for 6 to 8 hours after intercourse.  Cervical cap This is a round, soft, latex or plastic cup that fits over the cervix and must be fitted by a health care provider. The cap can be left in place for up to 48 hours after intercourse.  Sponge This is a soft, circular piece of polyurethane foam. The sponge has spermicide in it. It is inserted into the vagina after wetting it and before sexual intercourse.  Spermicides These are chemicals that kill or block sperm from entering the cervix and uterus. They come in the form of creams, jellies, suppositories, foam, or tablets. They do not require a   prescription. They are inserted into the vagina with an applicator before having sexual intercourse. The process must be repeated every time you have sexual intercourse. INTRAUTERINE CONTRACEPTION  Intrauterine device (IUD) This is a T-shaped device that is put in a woman's uterus during a  menstrual period to prevent pregnancy. There are 2 types:  Copper IUD This type of IUD is wrapped in copper wire and is placed inside the uterus. Copper makes the uterus and fallopian tubes produce a fluid that kills sperm. It can stay in place for 10 years.  Hormone IUD This type of IUD contains the hormone progestin (synthetic progesterone). The hormone thickens the cervical mucus and prevents sperm from entering the uterus, and it also thins the uterine lining to prevent implantation of a fertilized egg. The hormone can weaken or kill the sperm that get into the uterus. It can stay in place for 3 5 years, depending on which type of IUD is used. PERMANENT METHODS OF CONTRACEPTION  Female tubal ligation This is when the woman's fallopian tubes are surgically sealed, tied, or blocked to prevent the egg from traveling to the uterus.  Hysteroscopic sterilization This involves placing a small coil or insert into each fallopian tube. Your doctor uses a technique called hysteroscopy to do the procedure. The device causes scar tissue to form. This results in permanent blockage of the fallopian tubes, so the sperm cannot fertilize the egg. It takes about 3 months after the procedure for the tubes to become blocked. You must use another form of birth control for these 3 months.  Female sterilization This is when the female has the tubes that carry sperm tied off (vasectomy).This blocks sperm from entering the vagina during sexual intercourse. After the procedure, the man can still ejaculate fluid (semen). NATURAL PLANNING METHODS  Natural family planning This is not having sexual intercourse or using a barrier method (condom, diaphragm, cervical cap) on days the woman could become pregnant.  Calendar method This is keeping track of the length of each menstrual cycle and identifying when you are fertile.  Ovulation method This is avoiding sexual intercourse during ovulation.  Symptothermal method This is  avoiding sexual intercourse during ovulation, using a thermometer and ovulation symptoms.  Post ovulation method This is timing sexual intercourse after you have ovulated. Regardless of which type or method of contraception you choose, it is important that you use condoms to protect against the transmission of sexually transmitted infections (STIs). Talk with your health care provider about which form of contraception is most appropriate for you. Document Released: 05/21/2005 Document Revised: 01/21/2013 Document Reviewed: 11/13/2012 Exit[employer] Patient Information 2014 [employer].  

## 2013-06-11 ENCOUNTER — Ambulatory Visit (INDEPENDENT_AMBULATORY_CARE_PROVIDER_SITE_OTHER): Payer: Medicaid Other | Admitting: Obstetrics & Gynecology

## 2013-06-11 VITALS — BP 130/74 | HR 59 | Temp 97.8°F | Wt 134.0 lb

## 2013-06-11 DIAGNOSIS — Z30017 Encounter for initial prescription of implantable subdermal contraceptive: Secondary | ICD-10-CM

## 2013-06-11 DIAGNOSIS — Z3202 Encounter for pregnancy test, result negative: Secondary | ICD-10-CM

## 2013-06-11 DIAGNOSIS — Z01818 Encounter for other preprocedural examination: Secondary | ICD-10-CM

## 2013-06-11 LAB — POCT URINE PREGNANCY: PREG TEST UR: NEGATIVE

## 2013-06-11 NOTE — Progress Notes (Signed)
NEXPLANON INSERTION NOTE  Date of LMP: light bleeding currently, 1 month postpartum     Contraception used: none     Indications:  The patient desires contraception.  She understands risks, benefits, and alternatives to Implanon and would like to proceed.  Anesthesia:   Lidocaine 1% plain.  Procedure:  A time-out was performed confirming the procedure and the patient's allergy status.  The patient's non-dominant was identified as the left arm.  The protection cap was removed. While placing countertraction on the skin, the needle was inserted at a 30 degree angle.  The applicator was held horizontal to the skin; the skin was tented upward as the needle was introduced into the subdermal space.  While holding the applicator in place, the slider was unlocked. The Nexplanon was removed from the field.  The Nexplanon was palpated to ensure proper placement.  Complications: None  Instructions:  The patient was instructed to remove the dressing in 24 hours and that some bruising is to be expected.  She was advised to use over the counter analgesics as needed for any pain at the site.  She is to keep the area dry for 24 hours and to call if her hand or arm becomes cold, numb, or blue.  Return visit:  Return in 6+ weeks

## 2013-06-12 ENCOUNTER — Encounter: Payer: Self-pay | Admitting: Obstetrics & Gynecology

## 2013-06-12 NOTE — Patient Instructions (Signed)
Etonogestrel implant What is this medicine? ETONOGESTREL (et oh noe JES trel) is a contraceptive (birth control) device. It is used to prevent pregnancy. It can be used for up to 3 years. This medicine may be used for other purposes; ask your health care provider or pharmacist if you have questions. COMMON BRAND NAME(S): Implanon, Nexplanon  What should I tell my health care provider before I take this medicine? They need to know if you have any of these conditions: -abnormal vaginal bleeding -blood vessel disease or blood clots -cancer of the breast, cervix, or liver -depression -diabetes -gallbladder disease -headaches -heart disease or recent heart attack -high blood pressure -high cholesterol -kidney disease -liver disease -renal disease -seizures -tobacco smoker -an unusual or allergic reaction to etonogestrel, other hormones, anesthetics or antiseptics, medicines, foods, dyes, or preservatives -pregnant or trying to get pregnant -breast-feeding How should I use this medicine? This device is inserted just under the skin on the inner side of your upper arm by a health care professional. Talk to your pediatrician regarding the use of this medicine in children. Special care may be needed. Overdosage: If you think you've taken too much of this medicine contact a poison control center or emergency room at once. Overdosage: If you think you have taken too much of this medicine contact a poison control center or emergency room at once. NOTE: This medicine is only for you. Do not share this medicine with others. What if I miss a dose? This does not apply. What may interact with this medicine? Do not take this medicine with any of the following medications: -amprenavir -bosentan -fosamprenavir This medicine may also interact with the following medications: -barbiturate medicines for inducing sleep or treating seizures -certain medicines for fungal infections like ketoconazole and  itraconazole -griseofulvin -medicines to treat seizures like carbamazepine, felbamate, oxcarbazepine, phenytoin, topiramate -modafinil -phenylbutazone -rifampin -some medicines to treat HIV infection like atazanavir, indinavir, lopinavir, nelfinavir, tipranavir, ritonavir -St. John's wort This list may not describe all possible interactions. Give your health care provider a list of all the medicines, herbs, non-prescription drugs, or dietary supplements you use. Also tell them if you smoke, drink alcohol, or use illegal drugs. Some items may interact with your medicine. What should I watch for while using this medicine? This product does not protect you against HIV infection (AIDS) or other sexually transmitted diseases. You should be able to feel the implant by pressing your fingertips over the skin where it was inserted. Tell your doctor if you cannot feel the implant. What side effects may I notice from receiving this medicine? Side effects that you should report to your doctor or health care professional as soon as possible: -allergic reactions like skin rash, itching or hives, swelling of the face, lips, or tongue -breast lumps -changes in vision -confusion, trouble speaking or understanding -dark urine -depressed mood -general ill feeling or flu-like symptoms -light-colored stools -loss of appetite, nausea -right upper belly pain -severe headaches -severe pain, swelling, or tenderness in the abdomen -shortness of breath, chest pain, swelling in a leg -signs of pregnancy -sudden numbness or weakness of the face, arm or leg -trouble walking, dizziness, loss of balance or coordination -unusual vaginal bleeding, discharge -unusually weak or tired -yellowing of the eyes or skin Side effects that usually do not require medical attention (Report these to your doctor or health care professional if they continue or are bothersome.): -acne -breast pain -changes in  weight -cough -fever or chills -headache -irregular menstrual bleeding -itching, burning,   and vaginal discharge -pain or difficulty passing urine -sore throat This list may not describe all possible side effects. Call your doctor for medical advice about side effects. You may report side effects to FDA at 1-800-FDA-1088. Where should I keep my medicine? This drug is given in a hospital or clinic and will not be stored at home. NOTE: This sheet is a summary. It may not cover all possible information. If you have questions about this medicine, talk to your doctor, pharmacist, or health care provider.  2014, Elsevier/Gold Standard. (2011-11-26 15:37:45)  

## 2013-07-23 ENCOUNTER — Ambulatory Visit (INDEPENDENT_AMBULATORY_CARE_PROVIDER_SITE_OTHER): Payer: Medicaid Other | Admitting: Obstetrics & Gynecology

## 2013-07-23 DIAGNOSIS — Z0000 Encounter for general adult medical examination without abnormal findings: Secondary | ICD-10-CM

## 2013-07-23 NOTE — Progress Notes (Signed)
Subjective:     [name] is a 43 y.o. female here for a follow up Neplanon insertion exam.  Current complaints: Pt states that she is doing well.  Pt states that she feels soreness and numbness in left shoulder area.  Pt would also like pelvic exam.  Pt hasn't had exam since initial PP visit.  Personal health questionnaire reviewed: yes.   Gynecologic History No LMP recorded. Patient has had an implant. Contraception: Nexplanon Last Pap: 10/2012. Results were: normal Last mammogram: n/a  Obstetric History OB History  Gravida Para Term Preterm AB SAB TAB Ectopic Multiple Living  8 8 8       8     # Outcome Date GA Lbr Len/2nd Weight Sex Delivery Anes PTL Lv  8 TRM 05/08/13 10384w0d 12:44 / 00:13 7 lb 12.9 oz (3.54 kg) F VBAC EPI  Y  7 TRM 02/20/07 865w0d  7 lb 14 oz (3.572 kg) M VBAC EPI  Y     Comments: No complications  6 TRM 01/18/03 7565w0d  6 lb 6 oz (2.892 kg) F LTCS   Y     Comments: No complications - breech  5 TRM 12/30/97 2965w0d  8 lb (3.629 kg) M SVD None  Y     Comments: No complications  4 TRM 12/11/96 6265w0d  7 lb 15 oz (3.6 kg) F SVD None  Y     Comments: No complications  3 TRM 08/10/95 265w0d  7 lb 6 oz (3.345 kg) F SVD None  Y     Comments: No complications  2 TRM 11/25/93 3965w0d  7 lb (3.175 kg) F SVD None  Y     Comments: No complications  1 TRM 06/05/90 6065w0d  7 lb (3.175 kg) M SVD None  Y     Comments: No comp;ications       The following portions of the patient's history were reviewed and updated as appropriate: allergies, current medications, past family history, past medical history, past social history, past surgical history and problem list.  Review of Systems Pertinent items are noted in HPI.    Objective:      General:  alert     Abdomen: soft, non-tender; bowel sounds normal; no masses,  no organomegaly   Vulva:  normal  Vagina: normal vagina  Cervix:  no lesions  Corpus: normal size, contour, position, consistency, mobility, non-tender  Adnexa:   normal adnexa  LUE: implant palpable; incision site well-healed      Assessment:    Healthy female exam.    Plan:   Return prn

## 2013-07-24 ENCOUNTER — Encounter: Payer: Self-pay | Admitting: Obstetrics & Gynecology

## 2013-07-24 LAB — PAP IG W/ RFLX HPV ASCU

## 2013-07-24 NOTE — Patient Instructions (Signed)

## 2014-04-05 ENCOUNTER — Encounter: Payer: Self-pay | Admitting: Obstetrics & Gynecology

## 2014-05-31 ENCOUNTER — Encounter: Payer: Self-pay | Admitting: *Deleted

## 2014-06-01 ENCOUNTER — Encounter: Payer: Self-pay | Admitting: Obstetrics & Gynecology

## 2014-07-26 ENCOUNTER — Ambulatory Visit: Payer: Medicaid Other | Admitting: Obstetrics & Gynecology

## 2015-07-02 ENCOUNTER — Ambulatory Visit (INDEPENDENT_AMBULATORY_CARE_PROVIDER_SITE_OTHER): Payer: Managed Care, Other (non HMO) | Admitting: Internal Medicine

## 2015-07-02 VITALS — BP 100/68 | HR 74 | Temp 97.9°F | Resp 20 | Ht 59.0 in | Wt 155.6 lb

## 2015-07-02 DIAGNOSIS — H66002 Acute suppurative otitis media without spontaneous rupture of ear drum, left ear: Secondary | ICD-10-CM

## 2015-07-02 DIAGNOSIS — H9203 Otalgia, bilateral: Secondary | ICD-10-CM

## 2015-07-02 DIAGNOSIS — G8929 Other chronic pain: Secondary | ICD-10-CM | POA: Diagnosis not present

## 2015-07-02 MED ORDER — AMOXICILLIN 875 MG PO TABS: 875.0000 mg | ORAL_TABLET | Freq: Two times a day (BID) | ORAL | Status: DC

## 2015-07-02 MED ORDER — NEOMYCIN-POLYMYXIN-HC 3.5-10000-1 OT SUSP: 3.0000 [drp] | Freq: Four times a day (QID) | OTIC | Status: DC

## 2015-07-02 NOTE — Progress Notes (Signed)
   Subjective:    Patient ID: [name], female    DOB: May 06, 1971, 45 y.o.   MRN: [id number]  HPIL ear pain 3d following uri sxt 7-10 days--started with rhinitis and sore throat-no fever No cough or wheeze Still feeels very congested  Fr [place] here 22 yr Childhood untreated ear infec with rupture/perf No current hearing loss but freq l ear itching w/ occas pain AR plus conjunc occas On no meds  Review of Systems No other compl    Objective:   Physical Exam BP 100/68 mmHg  Pulse 74  Temp(Src) 97.9 F (36.6 C) (Oral)  Resp 20  Ht  (1.499 m)  Wt 155 lb 9.6 oz (70.58 kg)  BMI 31.41 kg/m2  SpO2 98% L Tm red with bullae superiorly and old perf inferiorly No pus draining Skin dry/sl flakey R Tm chr perf Nares boggy thr clear No resp distr      Assessment & Plan:  Acute suppurative otitis media of left ear without spontaneous rupture of tympanic membrane, recurrence not specified  Chronic ear pain, bilateral  Meds ordered this encounter  Medications  . amoxicillin (AMOXIL) 875 MG tablet    Sig: Take 1 tablet (875 mg total) by mouth 2 (two) times daily.    Dispense:  20 tablet    Refill:  0  . neomycin-polymyxin-hydrocortisone (CORTISPORIN) 3.5-10000-1 otic suspension    Sig: Place 3 drops into the left ear 4 (four) times daily.    Dispense:  10 mL    Refill:  0   Disc possible ent f/u

## 2016-03-12 ENCOUNTER — Ambulatory Visit (INDEPENDENT_AMBULATORY_CARE_PROVIDER_SITE_OTHER): Payer: Managed Care, Other (non HMO) | Admitting: Physician Assistant

## 2016-03-12 VITALS — BP 120/78 | HR 71 | Temp 97.9°F | Resp 16 | Ht 59.0 in | Wt 149.0 lb

## 2016-03-12 DIAGNOSIS — Z23 Encounter for immunization: Secondary | ICD-10-CM | POA: Diagnosis not present

## 2016-03-12 DIAGNOSIS — Z130 Encounter for screening for diseases of the blood and blood-forming organs and certain disorders involving the immune mechanism: Secondary | ICD-10-CM

## 2016-03-12 DIAGNOSIS — Z13228 Encounter for screening for other metabolic disorders: Secondary | ICD-10-CM | POA: Diagnosis not present

## 2016-03-12 DIAGNOSIS — Z13220 Encounter for screening for lipoid disorders: Secondary | ICD-10-CM

## 2016-03-12 DIAGNOSIS — Z8349 Family history of other endocrine, nutritional and metabolic diseases: Secondary | ICD-10-CM | POA: Diagnosis not present

## 2016-03-12 DIAGNOSIS — Z0000 Encounter for general adult medical examination without abnormal findings: Secondary | ICD-10-CM

## 2016-03-12 DIAGNOSIS — Z8639 Personal history of other endocrine, nutritional and metabolic disease: Secondary | ICD-10-CM | POA: Diagnosis not present

## 2016-03-12 LAB — CBC WITH DIFFERENTIAL/PLATELET
BASOS ABS: 62 {cells}/uL (ref 0–200)
Basophils Relative: 1 %
EOS ABS: 930 {cells}/uL — AB (ref 15–500)
EOS PCT: 15 %
HCT: 42.8 % (ref 35.0–45.0)
HEMOGLOBIN: 14.1 g/dL (ref 11.7–15.5)
LYMPHS ABS: 1922 {cells}/uL (ref 850–3900)
Lymphocytes Relative: 31 %
MCH: 27.9 pg (ref 27.0–33.0)
MCHC: 32.9 g/dL (ref 32.0–36.0)
MCV: 84.6 fL (ref 80.0–100.0)
MPV: 9.8 fL (ref 7.5–12.5)
Monocytes Absolute: 558 cells/uL (ref 200–950)
Monocytes Relative: 9 %
NEUTROS PCT: 44 %
Neutro Abs: 2728 cells/uL (ref 1500–7800)
Platelets: 280 10*3/uL (ref 140–400)
RBC: 5.06 MIL/uL (ref 3.80–5.10)
RDW: 14.1 % (ref 11.0–15.0)
WBC: 6.2 10*3/uL (ref 3.8–10.8)

## 2016-03-12 LAB — COMPLETE METABOLIC PANEL WITH GFR
ALBUMIN: 4.3 g/dL (ref 3.6–5.1)
ALK PHOS: 42 U/L (ref 33–115)
ALT: 9 U/L (ref 6–29)
AST: 14 U/L (ref 10–30)
BILIRUBIN TOTAL: 0.4 mg/dL (ref 0.2–1.2)
BUN: 8 mg/dL (ref 7–25)
CALCIUM: 9.1 mg/dL (ref 8.6–10.2)
CO2: 25 mmol/L (ref 20–31)
Chloride: 103 mmol/L (ref 98–110)
Creat: 0.64 mg/dL (ref 0.50–1.10)
GLUCOSE: 80 mg/dL (ref 65–99)
POTASSIUM: 4.1 mmol/L (ref 3.5–5.3)
SODIUM: 137 mmol/L (ref 135–146)
Total Protein: 7.3 g/dL (ref 6.1–8.1)

## 2016-03-12 LAB — LIPID PANEL
CHOL/HDL RATIO: 4.7 ratio (ref <=5.0)
CHOLESTEROL: 141 mg/dL (ref 125–200)
HDL: 30 mg/dL — AB (ref >=46)
LDL Cholesterol: 77 mg/dL (ref <130)
TRIGLYCERIDES: 170 mg/dL — AB (ref <150)
VLDL: 34 mg/dL — ABNORMAL HIGH (ref <30)

## 2016-03-12 LAB — TSH: TSH: 1.04 mIU/L

## 2016-03-12 MED ORDER — CETIRIZINE HCL 10 MG PO TABS: 10.0000 mg | ORAL_TABLET | Freq: Every day | ORAL | 11 refills | Status: DC

## 2016-03-12 MED ORDER — FLUTICASONE PROPIONATE 50 MCG/ACT NA SUSP: 2.0000 | Freq: Every day | NASAL | 6 refills | Status: DC

## 2016-03-12 NOTE — Patient Instructions (Signed)
     IF you received an x-ray today, you will receive an invoice from [facility]. Please contact [facility] at [phone] with questions or concerns regarding your invoice.   IF you received labwork today, you will receive an invoice from [facility]. Please contact [facility] at [phone] with questions or concerns regarding your invoice.   Our billing staff will not be able to assist you with questions regarding bills from these companies.  You will be contacted with the lab results as soon as they are available. The fastest way to get your results is to activate your My Chart account. Instructions are located on the last page of this paperwork. If you have not heard from us regarding the results in 2 weeks, please contact this office.     We recommend that you schedule a mammogram for breast cancer screening. Typically, you do not need a referral to do this. Please contact a local imaging center to schedule your mammogram.  [facility] - [phone]  *ask for the Radiology Department [facility]) - [phone] or [phone]  [facility] - [phone] [facility] - [phone] [facility] - [phone]  *ask for the Radiology Department [facility] - [phone]  *ask for the Radiology Department [facility] - [phone]  *ask for the Mammography Department [facility] - [phone]  

## 2016-03-12 NOTE — Progress Notes (Signed)
[name]  MRN: [id number] DOB: 01/27/1971  Subjective:  Patient is a 45 y.o. female who presents for annual physical exam.  Social: She is from [place], [place]. 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 She is sexually active with monogamous husband of 26 years.   Diet: She eats rice, spaghetti, vegetables, fruit, meat. She drinks milk, water, and juice.   Exercise: No structured exercise. Goes to the park sometimes with her kids and walks.   Sleep: She gets about 7 hours of sleep a night. Feels well rested throughout the day.   Allergic rhinitis: Takes OTC antihistamine. Notes that her allergies are worse in the winter. She is having runny nose, sneezing and itching watery eyes right now.   Medications: Pt has had the nexplanon implant since 2015. She is followed for this at [facility].   Of note, pt did eat a date this morning for breakfast so she is technically not fasting.   Last physical: 2014 Last dental exam: 01/2016 Last vision exam: Never Last pap smear: 07/23/2013 Last mammogram: Never  Vaccinations      Tetanus: 02/09/2013       Patient Active Problem List   Diagnosis Date Noted  . Allergic conjunctivitis and rhinitis 11/05/2012  . Unspecified vitamin D deficiency 10/22/2012    No current outpatient prescriptions on file prior to visit.   No current facility-administered medications on file prior to visit.    [name] has a current medication list which includes the following prescription(s): etonogestrel, cetirizine, and fluticasone.  No Known Allergies  Social History   Social History  . Marital status: Married    Spouse name: N/A  . Number of children: N/A  . Years of education: N/A   Social History Main Topics  . Smoking status: Never Smoker  . Smokeless tobacco: Never Used  . Alcohol use No  . Drug use: No  . Sexual activity: Yes    Birth control/ protection: None   Other Topics Concern  . None   Social History  Narrative  . None    Past Surgical History:  Procedure Laterality Date  . DILATION AND EVACUATION N/A 05/08/2013   Procedure: DILATATION AND EVACUATION;  Surgeon: Antionette Char, MD;  Location: [facility];  Service: Gynecology;  Laterality: N/A;    Family History  Problem Relation Age of Onset  . Hypertension Mother   . Asthma Father   . Diabetes Maternal Grandmother   . Hypertension Maternal Grandmother    Review of Systems  Constitutional: Negative.   HENT: Positive for congestion, postnasal drip, rhinorrhea and sneezing.   Eyes: Positive for itching. Negative for photophobia, pain, discharge, redness and visual disturbance.  Respiratory: Negative.   Cardiovascular: Negative.   Gastrointestinal: Negative.   Endocrine: Negative.   Genitourinary: Negative.   Musculoskeletal: Negative.   Skin: Negative.   Allergic/Immunologic: Negative.   Neurological: Negative.   Hematological: Negative.   Psychiatric/Behavioral: Negative.    Objective:  BP 120/78 (BP Location: Right Arm, Patient Position: Sitting, Cuff Size: Normal)   Pulse 71   Temp 97.9 F (36.6 C) (Oral)   Resp 16   Ht 4\' 11"  (1.499 m)   Wt 149 lb (67.6 kg)   SpO2 100%   BMI 30.09 kg/m   Physical Exam  Constitutional: She is oriented to person, place, and time and well-developed, well-nourished, and in no distress.  HENT:  Head: Normocephalic and atraumatic.  Right Ear: Hearing, external ear and ear canal  normal. 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  Left Ear: Hearing, tympanic membrane, external ear and ear canal normal.  Nose: Mucosal edema present.  Mouth/Throat: Uvula is midline and mucous membranes are normal. Posterior oropharyngeal erythema present. No oropharyngeal exudate.  Eyes: Conjunctivae, EOM and lids are normal. Pupils are equal, round, and reactive to light. No scleral icterus.  Triangular tissue growth noted in medial aspect of cornea bilaterally, no pupil involvement.   Neck:  Trachea normal and normal range of motion. No thyroid mass and no thyromegaly present.  Cardiovascular: Normal rate, regular rhythm, normal heart sounds and intact distal pulses.   Pulmonary/Chest: Effort normal and breath sounds normal.  Abdominal: Soft. Normal appearance and bowel sounds are normal. There is no tenderness.  Lymphadenopathy:       Head (right side): No tonsillar, no preauricular, no posterior auricular and no occipital adenopathy present.       Head (left side): No tonsillar, no preauricular, no posterior auricular and no occipital adenopathy present.    She has no cervical adenopathy.       Right: No supraclavicular adenopathy present.       Left: No supraclavicular adenopathy present.  Neurological: She is alert and oriented to person, place, and time. She has normal sensation, normal strength and normal reflexes. Gait normal.  Skin: Skin is warm and dry.  Psychiatric: Affect normal.    Visual Acuity Screening   Right eye Left eye Both eyes  Without correction: 20/20 20/70 20/40   With correction:       Assessment and Plan :  Discussed healthy lifestyle, diet, exercise, preventative care, vaccinations, and addressed patient's concerns. Plan for follow up in one year. Otherwise, plan for specific conditions below.  1. Annual physical exam Await results.  2. Screening, anemia, deficiency, iron - CBC with Differential/Platelet  3. Screening for metabolic disorder - COMPLETE METABOLIC PANEL WITH GFR  4. Screening, lipid - Lipid panel  5. Family history of thyroid disease - TSH  6. History of vitamin D deficiency - VITAMIN D 25 Hydroxy (Vit-D Deficiency, Fractures)  7. Flu vaccine need - Flu Vaccine QUAD 36+ mos IM   Cassandra CoreBrittany Cassandra Freehling PA-C  [facility] 03/12/2016 2:52 PM

## 2016-03-13 LAB — VITAMIN D 25 HYDROXY (VIT D DEFICIENCY, FRACTURES): Vit D, 25-Hydroxy: 9 ng/mL — ABNORMAL LOW (ref 30–100)

## 2016-03-14 ENCOUNTER — Other Ambulatory Visit: Payer: Self-pay | Admitting: Physician Assistant

## 2016-03-14 DIAGNOSIS — E559 Vitamin D deficiency, unspecified: Secondary | ICD-10-CM

## 2016-03-14 MED ORDER — VITAMIN D (ERGOCALCIFEROL) 1.25 MG (50000 UNIT) PO CAPS: 50000.0000 [IU] | ORAL_CAPSULE | ORAL | 0 refills | Status: DC

## 2016-03-14 NOTE — Progress Notes (Signed)
Pt called, no answer, voicemail left explaining lab results and that an order was placed for:  Meds ordered this encounter  Medications  . Vitamin D, Ergocalciferol, (DRISDOL) 50000 units CAPS capsule    Sig: Take 1 capsule (50,000 Units total) by mouth every 7 (seven) days.    Dispense:  10 capsule    Refill:  0    Order Specific Question:   Supervising Provider    Answer:   Ethelda ChickSMITH, KRISTI M [[id number]]   Pt to follow up in 8 weeks for repeat vit d level.

## 2016-10-09 ENCOUNTER — Encounter: Payer: Self-pay | Admitting: Physician Assistant

## 2016-10-09 ENCOUNTER — Encounter: Payer: Managed Care, Other (non HMO) | Admitting: Physician Assistant

## 2016-10-09 NOTE — Patient Instructions (Signed)
     IF you received an x-ray today, you will receive an invoice from [facility]. Please contact [facility] at [phone] with questions or concerns regarding your invoice.   IF you received labwork today, you will receive an invoice from [facility]. Please contact [facility] at [phone] with questions or concerns regarding your invoice.   Our billing staff will not be able to assist you with questions regarding bills from these companies.  You will be contacted with the lab results as soon as they are available. The fastest way to get your results is to activate your My Chart account. Instructions are located on the last page of this paperwork. If you have not heard from us regarding the results in 2 weeks, please contact this office.     

## 2016-10-10 ENCOUNTER — Telehealth: Payer: Self-pay | Admitting: Physician Assistant

## 2016-10-10 ENCOUNTER — Encounter: Payer: Self-pay | Admitting: Physician Assistant

## 2016-10-10 ENCOUNTER — Ambulatory Visit (INDEPENDENT_AMBULATORY_CARE_PROVIDER_SITE_OTHER): Payer: Managed Care, Other (non HMO) | Admitting: Physician Assistant

## 2016-10-10 VITALS — BP 112/75 | HR 60 | Temp 98.2°F | Resp 16 | Ht 59.0 in | Wt 148.6 lb

## 2016-10-10 DIAGNOSIS — Z30017 Encounter for initial prescription of implantable subdermal contraceptive: Secondary | ICD-10-CM | POA: Diagnosis not present

## 2016-10-10 DIAGNOSIS — Z3046 Encounter for surveillance of implantable subdermal contraceptive: Secondary | ICD-10-CM

## 2016-10-10 DIAGNOSIS — Z308 Encounter for other contraceptive management: Secondary | ICD-10-CM | POA: Diagnosis not present

## 2016-10-10 LAB — POCT URINE PREGNANCY: PREG TEST UR: NEGATIVE

## 2016-10-10 NOTE — Progress Notes (Signed)
Erroneous note

## 2016-10-10 NOTE — Patient Instructions (Signed)
     IF you received an x-ray today, you will receive an invoice from [facility]. Please contact [facility] at [phone] with questions or concerns regarding your invoice.   IF you received labwork today, you will receive an invoice from [facility]. Please contact [facility] at [phone] with questions or concerns regarding your invoice.   Our billing staff will not be able to assist you with questions regarding bills from these companies.  You will be contacted with the lab results as soon as they are available. The fastest way to get your results is to activate your My Chart account. Instructions are located on the last page of this paperwork. If you have not heard from us regarding the results in 2 weeks, please contact this office.     

## 2016-10-10 NOTE — Telephone Encounter (Signed)
Please authorize asap.  She is returning today for the nexplanon

## 2016-10-10 NOTE — Progress Notes (Signed)
PRIMARY CARE AT [facility] [address], [place] [address] [phone]  Date:  10/10/2016   Name:  [name]   DOB:  January 14, 1971   MRN:  [id number]  PCP:  Patient, No Pcp Per    History of Present Illness:  [name] is a 46 y.o. female patient who presents to PCP with  Chief Complaint  Patient presents with  . Contraception    Remove and replace birth control     --patient would like to have her nexplanon replaced at this time.  This was placed at her OBGYN 07/2013.  She is not using any form of contraception at this time, as this nexplanon is 3 months past expiration and need for replacement.  She does not have periods with the nexplanon. --she reports no side effects of the medicine.   Patient Active Problem List   Diagnosis Date Noted  . Allergic conjunctivitis and rhinitis 11/05/2012  . Unspecified vitamin D deficiency 10/22/2012    No past medical history on file.  Past Surgical History:  Procedure Laterality Date  . DILATION AND EVACUATION N/A 05/08/2013   Procedure: DILATATION AND EVACUATION;  Surgeon: Antionette Char, MD;  Location: [facility];  Service: Gynecology;  Laterality: N/A;     Social History  Substance Use Topics  . Smoking status: Never Smoker  . Smokeless tobacco: Never Used  . Alcohol use No    Family History  Problem Relation Age of Onset  . Hypertension Mother   . Asthma Father   . Diabetes Maternal Grandmother   . Hypertension Maternal Grandmother     No Known Allergies  Medication list has been reviewed and updated.  Current Outpatient Prescriptions on File Prior to Visit  Medication Sig Dispense Refill  . cetirizine (ZYRTEC) 10 MG tablet Take 1 tablet (10 mg total) by mouth daily. 30 tablet 11  . etonogestrel (NEXPLANON) 68 MG IMPL implant 1 each by Subdermal route once.    . fluticasone (FLONASE) 50 MCG/ACT nasal spray Place 2 sprays into both nostrils daily. 16 g 6  . Vitamin D, Ergocalciferol, (DRISDOL) 50000 units CAPS  capsule Take 1 capsule (50,000 Units total) by mouth every 7 (seven) days. 10 capsule 0   No current facility-administered medications on file prior to visit.     ROS ROS otherwise unremarkable unless listed above.  Physical Examination: BP 112/75   Pulse 60   Temp 98.2 F (36.8 C) (Oral)   Resp 16   Ht 4\' 11"  (1.499 m)   Wt 148 lb 9.6 oz (67.4 kg)   SpO2 98%   BMI 30.01 kg/m  Ideal Body Weight: Weight in (lb) to have BMI = 25: 123.5  Physical Exam  Constitutional: She is oriented to person, place, and time. She appears well-developed and well-nourished. No distress.  HENT:  Head: Normocephalic and atraumatic.  Right Ear: External ear normal.  Left Ear: External ear normal.  Eyes: Conjunctivae and EOM are normal. Pupils are equal, round, and reactive to light.  Cardiovascular: Normal rate.   Pulmonary/Chest: Effort normal. No respiratory distress.  Neurological: She is alert and oriented to person, place, and time.  Skin: She is not diaphoretic.  Psychiatric: She has a normal mood and affect. Her behavior is normal.   Results for orders placed or performed in visit on 10/10/16  POCT urine pregnancy  Result Value Ref Range   Preg Test, Ur Negative Negative    PROCEDURE: verbal consent obtained.  Left arm palpated to locate the contraceptive  implant.  Procedure site swabbed with alcohol.  1% lidocaine with epi placed.  Anesthesia obtained.  Swabbed with povidone.  11 blade utilized while gently palpating the inserted nexplanon at the distal end to make a small .3cm incision.  nexplanon removed without complication.  Nexplanon device used to insert the new implant at this location.  Pressure placed at the opening.  Bleeding easily subsided.  Procedure site cleansed with normal saline.  Pressure dressing applied, after steri-strip placed to close the wound.    LOT [id number]  [id number], exp 03/2019  Assessment and Plan: [name] is a 46 y.o. female who is here today for  removal and insertion of nexplanon. Successful.  Advised complications to warrant immediate return.  Advised to return for removal in 3 years. Insertion of Nexplanon  Nexplanon removal  Encounter for other contraceptive management - Plan: POCT urine pregnancy  [name] Conti, PA-C [facility] 5/11/20188:46 AM

## 2016-10-10 NOTE — Telephone Encounter (Signed)
Called [employer].  No prior auth needed.  Covered at 100%.  Stephanie aware.

## 2017-02-01 ENCOUNTER — Encounter: Payer: Self-pay | Admitting: Physician Assistant

## 2017-02-01 ENCOUNTER — Ambulatory Visit (INDEPENDENT_AMBULATORY_CARE_PROVIDER_SITE_OTHER): Payer: Managed Care, Other (non HMO) | Admitting: Physician Assistant

## 2017-02-01 VITALS — BP 118/72 | HR 64 | Temp 98.6°F | Resp 17 | Ht <= 58 in | Wt 149.0 lb

## 2017-02-01 DIAGNOSIS — H60392 Other infective otitis externa, left ear: Secondary | ICD-10-CM | POA: Diagnosis not present

## 2017-02-01 DIAGNOSIS — H9202 Otalgia, left ear: Secondary | ICD-10-CM

## 2017-02-01 LAB — POCT SKIN KOH: Skin KOH, POC: NEGATIVE

## 2017-02-01 MED ORDER — OFLOXACIN 0.3 % OT SOLN: 10.0000 [drp] | Freq: Every day | OTIC | 0 refills | Status: AC

## 2017-02-01 NOTE — Progress Notes (Signed)
Cassandra ReeveKhadija Mitchell  MRN: [id number] DOB: 08/26/1970  Subjective:  [name] is a 46 y.o. female seen in office today for a chief complaint of left ear fullness and itching. Has had intermittent fullness over the past 3 months. Just noticed the itching and discharge over the past few days. Notes the discharge is yellowish brown. Denies pain, decreased hearing, and tinnitus. Does use qtips frequently. Has hx of left ear issues. Has been treated in the past for similar issue with ear drops, which do help but it always seems to return. Had hx of ear infections as a kid. Has never seen an ENT.   Review of Systems  Constitutional: Negative for chills, diaphoresis and fever.  HENT: Positive for congestion. Negative for rhinorrhea, sinus pain, sinus pressure and sore throat.   Gastrointestinal: Negative for nausea and vomiting.  Neurological: Negative for dizziness.    Patient Active Problem List   Diagnosis Date Noted  . Allergic conjunctivitis and rhinitis 11/05/2012  . Unspecified vitamin D deficiency 10/22/2012    Current Outpatient Prescriptions on File Prior to Visit  Medication Sig Dispense Refill  . cetirizine (ZYRTEC) 10 MG tablet Take 1 tablet (10 mg total) by mouth daily. 30 tablet 11  . etonogestrel (NEXPLANON) 68 MG IMPL implant 1 each by Subdermal route once.    . fluticasone (FLONASE) 50 MCG/ACT nasal spray Place 2 sprays into both nostrils daily. 16 g 6  . Vitamin D, Ergocalciferol, (DRISDOL) 50000 units CAPS capsule Take 1 capsule (50,000 Units total) by mouth every 7 (seven) days. 10 capsule 0   No current facility-administered medications on file prior to visit.     No Known Allergies   Objective:  BP 118/72   Pulse 64   Temp 98.6 F (37 C) (Oral)   Resp 17   Ht 4\' 10"  (1.473 m)   Wt 149 lb (67.6 kg)   SpO2 98%   BMI 31.14 kg/m   Physical Exam  Constitutional: She is oriented to person, place, and time and well-developed, well-nourished, and in no distress.    HENT:  Head: Normocephalic and atraumatic.  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  Left Ear: There is drainage (creamy tannish brown discharge within canal, creamy yellow discharge and debri overlying TM, no fungal spores noted ) and swelling (and erythema of the canal). No tenderness (with palpation of tragus and manipulation of external ear). No foreign bodies. No mastoid tenderness. Tympanic membrane is not perforated (cannot visualize full TM, no perforation noted in the portion visualized) and not erythematous.  Normal Weber Test Rinne Test: [facility] >BC in left ear   Eyes: Conjunctivae are normal.  Neck: Normal range of motion.  Pulmonary/Chest: Effort normal.  Neurological: She is alert and oriented to person, place, and time. Gait normal.  Skin: Skin is warm and dry.  Psychiatric: Affect normal.  Vitals reviewed.  Results for orders placed or performed in visit on 02/01/17 (from the past 24 hour(s))  POCT Skin KOH     Status: None   Collection Time: 02/01/17  9:54 AM  Result Value Ref Range   Skin KOH, POC Negative Negative     Assessment and Plan :  1. Left ear pain - POCT Skin KOH 2. Infective otitis externa of left ear TM is not fully visualized. Physical exam findings consistent with otitis externa. Will treat accordingly. Patient encouraged to return on 02/05/17 to ensure appropriate healing. If no improvement at that time will consider  referral to ENT. Return sooner if symptoms worsen. - ofloxacin (FLOXIN OTIC) 0.3 % OTIC solution; Place 10 drops into the left ear daily.  Dispense: 5 mL; Refill: 0   Benjiman Core PA-C  [facility] 02/01/2017 9:57 AM

## 2017-02-01 NOTE — Patient Instructions (Addendum)
Use ear drops as prescribed. Below is some info about proper ear drop usage. Please return to office for reevaluation with me on 02/05/17. If no improvement by that time, we will consider referral to ENT. Return sooner if symptoms worsen. Thank you for letting me participate in your health and well being.   To use the ear drops: -Lie down or tilt your head with your ear facing upward. Open the ear canal by gently pulling your ear back, or pulling downward on the earlobe when giving this medicine to a child. -Hold the dropper upside down over your ear and drop the correct number of drops into the ear. -Stay lying down or with your head tilted for at least 5 minutes. You may use a small piece of cotton to plug the ear and keep the medicine from draining out. -Do not touch the dropper tip or place it directly in your ear. It may become contaminated. Wipe the tip with a clean tissue but do not wash with water or soap. -Use this medicine for the full prescribed length of time. Your symptoms may improve before the infection is completely cleared. Skipping doses may also increase your risk of further infection that is resistant to antibiotics.  Otitis Externa Otitis externa is an infection of the outer ear canal. The outer ear canal is the area between the outside of the ear and the eardrum. Otitis externa is sometimes called "swimmer's ear." Follow these instructions at home:  If you were given antibiotic ear drops, use them as told by your doctor. Do not stop using them even if your condition gets better.  Take over-the-counter and prescription medicines only as told by your doctor.  Keep all follow-up visits as told by your doctor. This is important. How is this prevented?  Keep your ear dry. Use the corner of a towel to dry your ear after you swim or bathe.  Try not to scratch or put things in your ear. Doing these things makes it easier for germs to grow in your ear.  Avoid swimming in lakes,  dirty water, or pools that may not have the right amount of a chemical called chlorine.  Consider making ear drops and putting 3 or 4 drops in each ear after you swim. Ask your doctor about how you can make ear drops. Contact a doctor if:  You have a fever.  After 3 days your ear is still red, swollen, or painful.  After 3 days you still have pus coming from your ear.  Your redness, swelling, or pain gets worse.  You have a really bad headache.  You have redness, swelling, pain, or tenderness behind your ear. This information is not intended to replace advice given to you by your health care provider. Make sure you discuss any questions you have with your health care provider. Document Released: 11/07/2007 Document Revised: 06/16/2015 Document Reviewed: 02/28/2015 Elsevier Interactive Patient Education  2018 [employer]     IF you received an x-ray today, you will receive an [facility]. Please contact [facility] at [phone] with questions or concerns regarding your invoice.   IF you received labwork today, you will receive an invoice from [facility]. Please contact [facility] at [phone] with questions or concerns regarding your invoice.   Our billing staff will not be able to assist you with questions regarding bills from these companies.  You will be contacted with the lab results as soon as they are available. The fastest way to  get your results is to activate your My Chart account. Instructions are located on the last page of this paperwork. If you have not heard from [place] regarding the results in 2 weeks, please contact this office.

## 2017-02-05 ENCOUNTER — Encounter: Payer: Self-pay | Admitting: Family Medicine

## 2017-02-05 ENCOUNTER — Ambulatory Visit: Payer: Managed Care, Other (non HMO) | Admitting: Physician Assistant

## 2017-02-05 ENCOUNTER — Ambulatory Visit (INDEPENDENT_AMBULATORY_CARE_PROVIDER_SITE_OTHER): Payer: Managed Care, Other (non HMO) | Admitting: Family Medicine

## 2017-02-05 VITALS — BP 98/65 | HR 59 | Temp 98.4°F | Resp 18 | Ht 58.27 in | Wt 150.6 lb

## 2017-02-05 DIAGNOSIS — Z23 Encounter for immunization: Secondary | ICD-10-CM

## 2017-02-05 DIAGNOSIS — H6690 Otitis media, unspecified, unspecified ear: Secondary | ICD-10-CM | POA: Diagnosis not present

## 2017-02-05 DIAGNOSIS — H60392 Other infective otitis externa, left ear: Secondary | ICD-10-CM | POA: Diagnosis not present

## 2017-02-05 MED ORDER — AZITHROMYCIN 250 MG PO TABS: ORAL_TABLET | ORAL | 0 refills | Status: DC

## 2017-02-05 NOTE — Progress Notes (Signed)
Subjective:  By signing my name below, I, Essence Howell, attest that this documentation has been prepared under the direction and in the presence of Shade Flood, MD Electronically Signed: Charline Bills, ED Scribe 02/05/2017 at 9:41 AM.   Patient ID: [name], female    DOB: 28-Apr-1971, 46 y.o.   MRN: [id number]  Chief Complaint  Patient presents with  . Ear Pain    f/u - on left ear- pt states that her ear feels better   HPI [name] is a 46 y.o. female who presents to Primary Care at [facility] for follow-up on left ear pain. She was seen 4 days ago by [place], PA-C and diagnosed with otitis externa of the L ear. Started on Floxin 10 drops qd.   Today pt states symptoms have improved overall but she did notice some bleeding last week. Denies fever, decreased hearing, left ear pain at this time. She has been using the drops. No complaints from the right ear, specifically no decreased hearing in either ear.   Patient Active Problem List   Diagnosis Date Noted  . Allergic conjunctivitis and rhinitis 11/05/2012  . Unspecified vitamin D deficiency 10/22/2012   History reviewed. No pertinent past medical history. Past Surgical History:  Procedure Laterality Date  . DILATION AND EVACUATION N/A 05/08/2013   Procedure: DILATATION AND EVACUATION;  Surgeon: Antionette Char, MD;  Location: [facility];  Service: Gynecology;  Laterality: N/A;   No Known Allergies Prior to Admission medications   Medication Sig Start Date End Date Taking? Authorizing Provider  cetirizine (ZYRTEC) 10 MG tablet Take 1 tablet (10 mg total) by mouth daily. 03/12/16   Benjiman Core D, PA-C  etonogestrel (NEXPLANON) 68 MG IMPL implant 1 each by Subdermal route once.    [provider]  fluticasone (FLONASE) 50 MCG/ACT nasal spray Place 2 sprays into both nostrils daily. 03/12/16   Benjiman Core D, PA-C  ofloxacin (FLOXIN OTIC) 0.3 % OTIC solution Place 10 drops into the left ear daily.  02/01/17 02/15/17  Benjiman Core D, PA-C  Vitamin D, Ergocalciferol, (DRISDOL) 50000 units CAPS capsule Take 1 capsule (50,000 Units total) by mouth every 7 (seven) days. 03/14/16   Magdalene River, PA-C   Social History   Social History  . Marital status: Married    Spouse name: N/A  . Number of children: N/A  . Years of education: N/A   Occupational History  . Not on file.   Social History Main Topics  . Smoking status: Never Smoker  . Smokeless tobacco: Never Used  . Alcohol use No  . Drug use: No  . Sexual activity: Yes    Birth control/ protection: None   Other Topics Concern  . Not on file   Social History Narrative  . No narrative on file   Review of Systems  Constitutional: Negative for fever.  HENT: Negative for ear pain and hearing loss.       Objective:   Physical Exam  Constitutional: She is oriented to person, place, and time. She appears well-developed and well-nourished. No distress.  HENT:  Head: Normocephalic and atraumatic.  Right Ear: Hearing, tympanic membrane, external ear and ear canal normal.  Left Ear: Hearing, tympanic membrane, external ear and ear canal normal.  Nose: Nose normal.  Mouth/Throat: Oropharynx is clear and moist. No oropharyngeal exudate.  R canal clear.  L canal: some dried blood at the external aspect. Some blood at the canal and minimal exudate sitting against the TM,  with dull/erythematous TM on the left, and possible bulla at 1:00. No apparent perforation  Eyes: Pupils are equal, round, and reactive to light. Conjunctivae and EOM are normal.  Cardiovascular: Normal rate, regular rhythm, normal heart sounds and intact distal pulses.   No murmur heard. Pulmonary/Chest: Effort normal and breath sounds normal. No respiratory distress. She has no wheezes. She has no rhonchi.  Neurological: She is alert and oriented to person, place, and time.  Skin: Skin is warm and dry. No rash noted.  Psychiatric: She has a normal mood  and affect. Her behavior is normal.  Vitals reviewed.  Vitals:   02/05/17 0901  BP: 98/65  Pulse: (!) 59  Resp: 18  Temp: 98.4 F (36.9 C)  TempSrc: Oral  SpO2: 97%  Weight: 150 lb 9.6 oz (68.3 kg)  Height: 4' 10.27" (1.48 m)      Assessment & Plan:    Cassandra ReeveKhadija Kight is a 46 y.o. female Need for influenza vaccination - Plan: Flu Vaccine QUAD 6+ mos PF IM (Fluarix Quad PF)  Acute otitis media, unspecified otitis media type - Plan: azithromycin (ZITHROMAX) 250 MG tablet  Infective otitis externa of left ear - Plan: azithromycin (ZITHROMAX) 250 MG tablet  Improving external ear symptoms, but on exam today appears to have component of otitis media, with differential including bullous myringitis, and possible secondary rupture with blood noted in canal.. However no perforation seen today, hearing intact, pain controlled.   -Start azithromycin to cover otitis media, possible bullous myringitis.  -Continue Floxin Otic drops  -Recheck within next 1 week, sooner if pain, decreased hearing, or worsening. Understanding expressed  Meds ordered this encounter  Medications  . azithromycin (ZITHROMAX) 250 MG tablet    Sig: Take 2 pills by mouth on day 1, then 1 pill by mouth per day on days 2 through 5.    Dispense:  6 tablet    Refill:  0   Patient Instructions    External ear looks better, but it does appear that you have a middle ear infection as well. As we discussed, occasionally those can rupture and cause fluid within the canal.    Start new antibiotic pills for middle ear infection, see information below. Continue eardrops, and recheck in 6-7 days.  If any increased pain, fever, or decreased hearing, return sooner.   Otitis Media, Adult Otitis media occurs when there is inflammation and fluid in the middle ear. Your middle ear is a part of the ear that contains bones for hearing as well as air that helps send sounds to your brain. What are the causes? This condition is caused  by a blockage in the eustachian tube. This tube drains fluid from the ear to the back of the nose (nasopharynx). A blockage in this tube can be caused by an object or by swelling (edema) in the tube. Problems that can cause a blockage include:  A cold or other upper respiratory infection.  Allergies.  An irritant, such as tobacco smoke.  Enlarged adenoids. The adenoids are areas of soft tissue located high in the back of the throat, behind the nose and the roof of the mouth.  A mass in the nasopharynx.  Damage to the ear caused by pressure changes (barotrauma).  What are the signs or symptoms? Symptoms of this condition include:  Ear pain.  A fever.  Decreased hearing.  A headache.  Tiredness (lethargy).  Fluid leaking from the ear.  Ringing in the ear.  How is this diagnosed?  This condition is diagnosed with a physical exam. During the exam your health care provider will use an instrument called an otoscope to look into your ear and check for redness, swelling, and fluid. He or she will also ask about your symptoms. Your health care provider may also order tests, such as:  A test to check the movement of the eardrum (pneumatic otoscopy). This test is done by squeezing a small amount of air into the ear.  A test that changes air pressure in the middle ear to check how well the eardrum moves and whether the eustachian tube is working (tympanogram).  How is this treated? This condition usually goes away on its own within 3-5 days. But if the condition is caused by a bacteria infection and does not go away own its own, or keeps coming back, your health care provider may:  Prescribe antibiotic medicines to treat the infection.  Prescribe or recommend medicines to control pain.  Follow these instructions at home:  Take over-the-counter and prescription medicines only as told by your health care provider.  If you were prescribed an antibiotic medicine, take it as told by  your health care provider. Do not stop taking the antibiotic even if you start to feel better.  Keep all follow-up visits as told by your health care provider. This is important. Contact a health care provider if:  You have bleeding from your nose.  There is a lump on your neck.  You are not getting better in 5 days.  You feel worse instead of better. Get help right away if:  You have severe pain that is not controlled with medicine.  You have swelling, redness, or pain around your ear.  You have stiffness in your neck.  A part of your face is paralyzed.  The bone behind your ear (mastoid) is tender when you touch it.  You develop a severe headache. Summary  Otitis media is redness, soreness, and swelling of the middle ear.  This condition usually goes away on its own within 3-5 days.  If the problem does not go away in 3-5 days, your health care provider may prescribe or recommend medicines to treat your symptoms.  If you were prescribed an antibiotic medicine, take it as told by your health care provider. This information is not intended to replace advice given to you by your health care provider. Make sure you discuss any questions you have with your health care provider. Document Released: 02/24/2004 Document Revised: 05/11/2016 Document Reviewed: 05/11/2016 Elsevier Interactive Patient Education  2017 [employer].    IF you received an x-ray today, you will receive an invoice from [facility]. Please contact [facility] at [phone] with questions or concerns regarding your invoice.   IF you received labwork today, you will receive an invoice from [place]. Please contact [facility] at [phone] with questions or concerns regarding your invoice.   Our billing staff will not be able to assist you with questions regarding bills from these companies.  You will be contacted with the lab results as soon as they are available. The fastest way to  get your results is to activate your My Chart account. Instructions are located on the last page of this paperwork. If you have not heard from [place] regarding the results in 2 weeks, please contact this office.      I personally performed the services described in this documentation, which was scribed in my presence. The recorded information has been reviewed and considered for  accuracy and completeness, addended by me as needed, and agree with information above.  Signed,   Meredith Staggers, MD Primary Care at [facility].  02/05/17 10:13 AM

## 2017-02-05 NOTE — Patient Instructions (Addendum)
External ear looks better, but it does appear that you have a middle ear infection as well. As we discussed, occasionally those can rupture and cause fluid within the canal.    Start new antibiotic pills for middle ear infection, see information below. Continue eardrops, and recheck in 6-7 days.  If any increased pain, fever, or decreased hearing, return sooner.   Otitis Media, Adult Otitis media occurs when there is inflammation and fluid in the middle ear. Your middle ear is a part of the ear that contains bones for hearing as well as air that helps send sounds to your brain. What are the causes? This condition is caused by a blockage in the eustachian tube. This tube drains fluid from the ear to the back of the nose (nasopharynx). A blockage in this tube can be caused by an object or by swelling (edema) in the tube. Problems that can cause a blockage include:  A cold or other upper respiratory infection.  Allergies.  An irritant, such as tobacco smoke.  Enlarged adenoids. The adenoids are areas of soft tissue located high in the back of the throat, behind the nose and the roof of the mouth.  A mass in the nasopharynx.  Damage to the ear caused by pressure changes (barotrauma).  What are the signs or symptoms? Symptoms of this condition include:  Ear pain.  A fever.  Decreased hearing.  A headache.  Tiredness (lethargy).  Fluid leaking from the ear.  Ringing in the ear.  How is this diagnosed? This condition is diagnosed with a physical exam. During the exam your health care provider will use an instrument called an otoscope to look into your ear and check for redness, swelling, and fluid. He or she will also ask about your symptoms. Your health care provider may also order tests, such as:  A test to check the movement of the eardrum (pneumatic otoscopy). This test is done by squeezing a small amount of air into the ear.  A test that changes air pressure in the middle  ear to check how well the eardrum moves and whether the eustachian tube is working (tympanogram).  How is this treated? This condition usually goes away on its own within 3-5 days. But if the condition is caused by a bacteria infection and does not go away own its own, or keeps coming back, your health care provider may:  Prescribe antibiotic medicines to treat the infection.  Prescribe or recommend medicines to control pain.  Follow these instructions at home:  Take over-the-counter and prescription medicines only as told by your health care provider.  If you were prescribed an antibiotic medicine, take it as told by your health care provider. Do not stop taking the antibiotic even if you start to feel better.  Keep all follow-up visits as told by your health care provider. This is important. Contact a health care provider if:  You have bleeding from your nose.  There is a lump on your neck.  You are not getting better in 5 days.  You feel worse instead of better. Get help right away if:  You have severe pain that is not controlled with medicine.  You have swelling, redness, or pain around your ear.  You have stiffness in your neck.  A part of your face is paralyzed.  The bone behind your ear (mastoid) is tender when you touch it.  You develop a severe headache. Summary  Otitis media is redness, soreness, and swelling of the  middle ear.  This condition usually goes away on its own within 3-5 days.  If the problem does not go away in 3-5 days, your health care provider may prescribe or recommend medicines to treat your symptoms.  If you were prescribed an antibiotic medicine, take it as told by your health care provider. This information is not intended to replace advice given to you by your health care provider. Make sure you discuss any questions you have with your health care provider. Document Released: 02/24/2004 Document Revised: 05/11/2016 Document Reviewed:  05/11/2016 Elsevier Interactive Patient Education  2017 [employer]    IF you received an x-ray today, you will receive an invoice from [facility]. Please contact [facility] at [phone] with questions or concerns regarding your invoice.   IF you received labwork today, you will receive an invoice from [facility]. Please contact [facility] at [phone] with questions or concerns regarding your invoice.   Our billing staff will not be able to assist you with questions regarding bills from these companies.  You will be contacted with the lab results as soon as they are available. The fastest way to get your results is to activate your My Chart account. Instructions are located on the last page of this paperwork. If you have not heard from us regarding the results in 2 weeks, please contact this office.

## 2017-02-12 ENCOUNTER — Ambulatory Visit: Payer: Managed Care, Other (non HMO) | Admitting: Family Medicine

## 2017-09-04 ENCOUNTER — Encounter: Payer: Self-pay | Admitting: Physician Assistant

## 2017-09-28 ENCOUNTER — Encounter: Payer: 59 | Admitting: Physician Assistant

## 2018-04-15 ENCOUNTER — Telehealth: Payer: Self-pay | Admitting: Physician Assistant

## 2018-04-15 ENCOUNTER — Other Ambulatory Visit: Payer: Self-pay

## 2018-04-15 ENCOUNTER — Ambulatory Visit (INDEPENDENT_AMBULATORY_CARE_PROVIDER_SITE_OTHER): Payer: PRIVATE HEALTH INSURANCE | Admitting: Physician Assistant

## 2018-04-15 ENCOUNTER — Encounter: Payer: Self-pay | Admitting: Physician Assistant

## 2018-04-15 VITALS — BP 118/73 | HR 66 | Temp 98.2°F | Resp 18 | Ht 58.5 in | Wt 157.4 lb

## 2018-04-15 DIAGNOSIS — N852 Hypertrophy of uterus: Secondary | ICD-10-CM | POA: Diagnosis not present

## 2018-04-15 DIAGNOSIS — Z1389 Encounter for screening for other disorder: Secondary | ICD-10-CM | POA: Diagnosis not present

## 2018-04-15 DIAGNOSIS — Z23 Encounter for immunization: Secondary | ICD-10-CM

## 2018-04-15 DIAGNOSIS — Z13220 Encounter for screening for lipoid disorders: Secondary | ICD-10-CM

## 2018-04-15 DIAGNOSIS — Z13228 Encounter for screening for other metabolic disorders: Secondary | ICD-10-CM

## 2018-04-15 DIAGNOSIS — Z130 Encounter for screening for diseases of the blood and blood-forming organs and certain disorders involving the immune mechanism: Secondary | ICD-10-CM

## 2018-04-15 DIAGNOSIS — Z0000 Encounter for general adult medical examination without abnormal findings: Secondary | ICD-10-CM

## 2018-04-15 DIAGNOSIS — Z124 Encounter for screening for malignant neoplasm of cervix: Secondary | ICD-10-CM

## 2018-04-15 DIAGNOSIS — Z0001 Encounter for general adult medical examination with abnormal findings: Secondary | ICD-10-CM | POA: Diagnosis not present

## 2018-04-15 DIAGNOSIS — Z1329 Encounter for screening for other suspected endocrine disorder: Secondary | ICD-10-CM

## 2018-04-15 LAB — POCT URINALYSIS DIP (MANUAL ENTRY)
Bilirubin, UA: NEGATIVE
Glucose, UA: NEGATIVE mg/dL
Ketones, POC UA: NEGATIVE mg/dL
LEUKOCYTES UA: NEGATIVE
NITRITE UA: NEGATIVE
PH UA: 6 (ref 5.0–8.0)
Protein Ur, POC: NEGATIVE mg/dL
Spec Grav, UA: 1.02 (ref 1.010–1.025)
Urobilinogen, UA: 0.2 E.U./dL

## 2018-04-15 LAB — POC MICROSCOPIC URINALYSIS (UMFC): Mucus: ABSENT

## 2018-04-15 LAB — POCT URINE PREGNANCY: Preg Test, Ur: NEGATIVE

## 2018-04-15 MED ORDER — FLUTICASONE PROPIONATE 50 MCG/ACT NA SUSP: 2.0000 | Freq: Every day | NASAL | 6 refills | Status: DC

## 2018-04-15 MED ORDER — CETIRIZINE HCL 10 MG PO TABS: 10.0000 mg | ORAL_TABLET | Freq: Every day | ORAL | 11 refills | Status: DC

## 2018-04-15 NOTE — Progress Notes (Signed)
[name]  MRN: [id number] DOB: [birth year]  Subjective:  Pt is a 47 y.o. G81P8 female who presents for annual physical exam. Pt is fasting today.    Primary Preventative Screenings: Cervical Cancer: 2015, normal.  Family Planning: 8 children, does not plan to have anymore. Had nexplanon placed 2018. Has not had menstural cycle since.  STI screening: Not currently sexually active.  Breast Cancer: has never had mammogram, no FH of breast cancer Colorectal Cancer: no FH of colon cancer Weight/Blood sugar/Diet/Exercise:She eats rice, spaghetti, vegetables, fruit, meat. She drinks milk, water, and juice. No structured exercise. Goes to the park sometimes with her kids and walks.  OTC/Vit/Supp/Herbal:none  Dentist/Optho: every 6 months; never had eye exam. Immunizations: Flu: 2018 Tdap: 2014 Sleep: She gets about 7 hours of sleep a night. Feels well rested throughout the day.  BM: two times daily Allergic rhinitis: zyrtec and flonase daily. Working well for her.     Patient Active Problem List   Diagnosis Date Noted  . Allergic conjunctivitis and rhinitis 11/05/2012  . Unspecified vitamin D deficiency 10/22/2012    Current Outpatient Medications on File Prior to Visit  Medication Sig Dispense Refill  . azithromycin (ZITHROMAX) 250 MG tablet Take 2 pills by mouth on day 1, then 1 pill by mouth per day on days 2 through 5. 6 tablet 0  . cetirizine (ZYRTEC) 10 MG tablet Take 1 tablet (10 mg total) by mouth daily. 30 tablet 11  . etonogestrel (NEXPLANON) 68 MG IMPL implant 1 each by Subdermal route once.    . fluticasone (FLONASE) 50 MCG/ACT nasal spray Place 2 sprays into both nostrils daily. 16 g 6   No current facility-administered medications on file prior to visit.     No Known Allergies  Social History   Socioeconomic History  . Marital status: Married    Spouse name: Not on file  . Number of children: 8  . Years of education: Not on file  . Highest education level:  Not on file  Occupational History  . Not on file  Social Needs  . Financial resource strain: Not on file  . Food insecurity:    Worry: Not on file    Inability: Not on file  . Transportation needs:    Medical: Not on file    Non-medical: Not on file  Tobacco Use  . Smoking status: Never Smoker  . Smokeless tobacco: Never Used  Substance and Sexual Activity  . Alcohol use: No  . Drug use: No  . Sexual activity: Yes    Birth control/protection: None  Lifestyle  . Physical activity:    Days per week: Not on file    Minutes per session: Not on file  . Stress: Not on file  Relationships  . Social connections:    Talks on phone: Not on file    Gets together: Not on file    Attends religious service: Not on file    Active member of club or organization: Not on file    Attends meetings of clubs or organizations: Not on file    Relationship status: Not on file  Other Topics Concern  . Not on file  Social History Narrative  . Not on file    Past Surgical History:  Procedure Laterality Date  . DILATION AND EVACUATION N/A 05/08/2013   Procedure: DILATATION AND EVACUATION;  Surgeon: Lahoma Crocker, MD;  Location: [facility];  Service: Gynecology;  Laterality: N/A;    Family History  Problem Relation  Age of Onset  . Hypertension Mother   . Asthma Father   . Diabetes Maternal Grandmother   . Hypertension Maternal Grandmother     Review of Systems  Constitutional: Negative for activity change, appetite change, chills, diaphoresis, fatigue, fever and unexpected weight change.  HENT: Negative for congestion, dental problem, drooling, ear discharge, ear pain, facial swelling, hearing loss, mouth sores, nosebleeds, postnasal drip, rhinorrhea, sinus pressure, sinus pain, sneezing, sore throat, tinnitus, trouble swallowing and voice change.   Eyes: Negative for photophobia, pain, discharge, redness, itching and visual disturbance.  Respiratory: Negative for apnea, cough, choking,  chest tightness, shortness of breath, wheezing and stridor.   Cardiovascular: Negative for chest pain, palpitations and leg swelling.  Gastrointestinal: Negative for abdominal distention, abdominal pain, anal bleeding, blood in stool, constipation, diarrhea, nausea, rectal pain and vomiting.  Endocrine: Negative for cold intolerance, heat intolerance, polydipsia, polyphagia and polyuria.  Genitourinary: Negative for decreased urine volume, difficulty urinating, dyspareunia, dysuria, enuresis, flank pain, frequency, genital sores, hematuria, menstrual problem, pelvic pain, urgency, vaginal bleeding, vaginal discharge and vaginal pain.  Musculoskeletal: Negative for arthralgias, back pain, gait problem, joint swelling, myalgias, neck pain and neck stiffness.  Skin: Negative for color change, pallor, rash and wound.  Allergic/Immunologic: Negative for environmental allergies, food allergies and immunocompromised state.  Neurological: Negative for dizziness, tremors, seizures, syncope, facial asymmetry, speech difficulty, weakness, light-headedness, numbness and headaches.  Hematological: Negative for adenopathy. Does not bruise/bleed easily.  Psychiatric/Behavioral: Negative for agitation, behavioral problems, confusion, decreased concentration, dysphoric mood, hallucinations, self-injury, sleep disturbance and suicidal ideas. The patient is not nervous/anxious and is not hyperactive.     Objective:  BP 118/73   Pulse 66   Temp 98.2 F (36.8 C) (Oral)   Resp 18   Ht 4' 10.5" (1.486 m)   Wt 157 lb 6.4 oz (71.4 kg)   SpO2 98%   BMI 32.33 kg/m   Physical Exam  Constitutional: She is oriented to person, place, and time. She appears well-developed and well-nourished. No distress.  HENT:  Head: Normocephalic and atraumatic.  Right Ear: Hearing, tympanic membrane, external ear and ear canal normal.  Left Ear: Hearing, tympanic membrane, external ear and ear canal normal.  Nose: Nose normal.    Mouth/Throat: Uvula is midline, oropharynx is clear and moist and mucous membranes are normal. No oropharyngeal exudate.  Eyes: Pupils are equal, round, and reactive to light. Conjunctivae, EOM and lids are normal. No scleral icterus.  Neck: Trachea normal and normal range of motion. No thyroid mass and no thyromegaly present.  Cardiovascular: Normal rate, regular rhythm, normal heart sounds and intact distal pulses.  Pulmonary/Chest: Effort normal and breath sounds normal. Right breast exhibits no inverted nipple, no mass, no nipple discharge, no skin change and no tenderness. Left breast exhibits no inverted nipple, no mass, no nipple discharge, no skin change and no tenderness.  Abdominal: Soft. Normal appearance and bowel sounds are normal. There is no tenderness.  Genitourinary: Vagina normal. Uterus is enlarged (uterus is palpable up to superior aspect of mons pubis  ). Uterus is not deviated, not fixed and not tender. Cervix exhibits no motion tenderness, no discharge and no friability. Right adnexum displays no mass, no tenderness and no fullness. Left adnexum displays no mass, no tenderness and no fullness.  Genitourinary Comments: CMA chaperone present for breast and GU exam.  Transition zone noted on cervix.   Lymphadenopathy:       Head (right side): No tonsillar, no preauricular, no posterior auricular and  no occipital adenopathy present.       Head (left side): No tonsillar, no preauricular, no posterior auricular and no occipital adenopathy present.    She has no cervical adenopathy.       Right: No supraclavicular adenopathy present.       Left: No supraclavicular adenopathy present.  Neurological: She is alert and oriented to person, place, and time. She has normal strength and normal reflexes.  Skin: Skin is warm and dry.    Visual Acuity Screening   Right eye Left eye Both eyes  Without correction: _0  With correction:      Results for orders placed or  performed in visit on 04/15/18 (from the past 24 hour(s))  POCT urinalysis dipstick     Status: Abnormal   Collection Time: 04/15/18  4:36 PM  Result Value Ref Range   Color, UA yellow yellow   Clarity, UA clear clear   Glucose, UA negative negative mg/dL   Bilirubin, UA negative negative   Ketones, POC UA negative negative mg/dL   Spec Grav, UA 1.020 1.010 - 1.025   Blood, UA small (A) negative   pH, UA 6.0 5.0 - 8.0   Protein Ur, POC negative negative mg/dL   Urobilinogen, UA 0.2 0.2 or 1.0 E.U./dL   Nitrite, UA Negative Negative   Leukocytes, UA Negative Negative  POCT Microscopic Urinalysis ([facility])     Status: Abnormal   Collection Time: 04/15/18  5:04 PM  Result Value Ref Range   WBC,UR,HPF,POC None None WBC/hpf   RBC,UR,HPF,POC Few (A) None RBC/hpf   Bacteria Few (A) None, Too numerous to count   Mucus Absent Absent   Epithelial Cells, UR Per Microscopy Few (A) None, Too numerous to count cells/hpf  POCT urine pregnancy     Status: None   Collection Time: 04/15/18  5:04 PM  Result Value Ref Range   Preg Test, Ur Negative Negative     Assessment and Plan :  Discussed healthy lifestyle, diet, exercise, preventative care, vaccinations, and addressed patient's concerns. Plan for follow up as needed.  Otherwise, plan for specific conditions below.  1. Annual physical exam Await lab results.   2. Screening for metabolic disorder - SXJ15+ZMCE  3. Screening for deficiency anemia - CBC with Differential/Platelet  4. Screening for lipid disorders - Lipid panel  5. Screening for hematuria or proteinuria UA and urine micro with RBC, pt is asx. Rec repeat in 1-2 weeks.  - POCT urinalysis dipstick - POCT Microscopic Urinalysis ([facility])  6. Screening for thyroid disorder - TSH  7. Need for influenza vaccination - Flu Vaccine QUAD 36+ mos IM  8. Screening for cervical cancer - Pap IG and HPV (high risk) DNA detection  9. Enlarged uterus Uterus is enlarged on exam. No  tenderness. Pt is not having menstrual cycles since nexplanon placed. POCT preg negative. Will order transvaginal US for further evaluation.  - POCT urine pregnancy - US Transvaginal Non-OB; Future  Tenna Delaine, PA-C  [facility] at [facility] 04/15/2018 5:50 PM

## 2018-04-15 NOTE — Telephone Encounter (Signed)
Also let pt know that visual acuity was worse in left eye. She should call and schedule eye exam.

## 2018-04-15 NOTE — Telephone Encounter (Signed)
Please call pt, urine had some red blood cells in it. Will need to repeat in 1-2 weeks. Thanks!

## 2018-04-15 NOTE — Patient Instructions (Signed)

## 2018-04-16 LAB — CMP14+EGFR
A/G RATIO: 1.5 (ref 1.2–2.2)
ALBUMIN: 4.2 g/dL (ref 3.5–5.5)
ALK PHOS: 45 IU/L (ref 39–117)
ALT: 12 IU/L (ref 0–32)
AST: 19 IU/L (ref 0–40)
BUN / CREAT RATIO: 13 (ref 9–23)
BUN: 8 mg/dL (ref 6–24)
Bilirubin Total: 0.2 mg/dL (ref 0.0–1.2)
CO2: 20 mmol/L (ref 20–29)
CREATININE: 0.63 mg/dL (ref 0.57–1.00)
Calcium: 9.4 mg/dL (ref 8.7–10.2)
Chloride: 103 mmol/L (ref 96–106)
GFR calc Af Amer: 125 mL/min/{1.73_m2} (ref >59)
GFR, EST NON AFRICAN AMERICAN: 109 mL/min/{1.73_m2} (ref >59)
Globulin, Total: 2.8 g/dL (ref 1.5–4.5)
Glucose: 81 mg/dL (ref 65–99)
POTASSIUM: 4.1 mmol/L (ref 3.5–5.2)
SODIUM: 138 mmol/L (ref 134–144)
Total Protein: 7 g/dL (ref 6.0–8.5)

## 2018-04-16 LAB — CBC WITH DIFFERENTIAL/PLATELET
BASOS: 1 %
Basophils Absolute: 0 10*3/uL (ref 0.0–0.2)
EOS (ABSOLUTE): 0.3 10*3/uL (ref 0.0–0.4)
EOS: 6 %
HEMATOCRIT: 38.7 % (ref 34.0–46.6)
Hemoglobin: 13.8 g/dL (ref 11.1–15.9)
Immature Grans (Abs): 0 10*3/uL (ref 0.0–0.1)
Immature Granulocytes: 0 %
Lymphocytes Absolute: 2.2 10*3/uL (ref 0.7–3.1)
Lymphs: 41 %
MCH: 28.9 pg (ref 26.6–33.0)
MCHC: 35.7 g/dL (ref 31.5–35.7)
MCV: 81 fL (ref 79–97)
MONOS ABS: 0.4 10*3/uL (ref 0.1–0.9)
Monocytes: 7 %
Neutrophils Absolute: 2.4 10*3/uL (ref 1.4–7.0)
Neutrophils: 45 %
PLATELETS: 273 10*3/uL (ref 150–450)
RBC: 4.78 x10E6/uL (ref 3.77–5.28)
RDW: 13.1 % (ref 12.3–15.4)
WBC: 5.3 10*3/uL (ref 3.4–10.8)

## 2018-04-16 LAB — LIPID PANEL
CHOL/HDL RATIO: 4 ratio (ref 0.0–4.4)
Cholesterol, Total: 144 mg/dL (ref 100–199)
HDL: 36 mg/dL — ABNORMAL LOW (ref >39)
LDL Calculated: 89 mg/dL (ref 0–99)
Triglycerides: 95 mg/dL (ref 0–149)
VLDL Cholesterol Cal: 19 mg/dL (ref 5–40)

## 2018-04-16 LAB — TSH: TSH: 1.46 u[IU]/mL (ref 0.450–4.500)

## 2018-04-17 LAB — PAP IG AND HPV HIGH-RISK: HPV, high-risk: NEGATIVE

## 2018-04-18 ENCOUNTER — Ambulatory Visit
Admission: RE | Admit: 2018-04-18 | Discharge: 2018-04-18 | Disposition: A | Discharge status: Home / Self Care | Payer: Self-pay | Source: Ambulatory Visit | Attending: Physician Assistant | Admitting: Physician Assistant

## 2018-04-18 DIAGNOSIS — N852 Hypertrophy of uterus: Secondary | ICD-10-CM

## 2018-04-21 ENCOUNTER — Encounter: Payer: Self-pay | Admitting: *Deleted

## 2018-04-21 NOTE — Telephone Encounter (Signed)
Pt advised and transferred to schedulers for appointment

## 2018-05-02 ENCOUNTER — Ambulatory Visit (INDEPENDENT_AMBULATORY_CARE_PROVIDER_SITE_OTHER): Payer: 59 | Admitting: Family Medicine

## 2018-05-02 ENCOUNTER — Encounter: Payer: Self-pay | Admitting: Family Medicine

## 2018-05-02 ENCOUNTER — Other Ambulatory Visit: Payer: Self-pay

## 2018-05-02 VITALS — BP 116/76 | HR 67 | Temp 98.1°F | Ht 58.5 in | Wt 158.6 lb

## 2018-05-02 DIAGNOSIS — R3121 Asymptomatic microscopic hematuria: Secondary | ICD-10-CM

## 2018-05-02 LAB — POCT URINALYSIS DIP (MANUAL ENTRY)
Bilirubin, UA: NEGATIVE
Glucose, UA: NEGATIVE mg/dL
Ketones, POC UA: NEGATIVE mg/dL
Leukocytes, UA: NEGATIVE
Nitrite, UA: NEGATIVE
Protein Ur, POC: NEGATIVE mg/dL
Spec Grav, UA: 1.025 (ref 1.010–1.025)
Urobilinogen, UA: 0.2 E.U./dL
pH, UA: 5 (ref 5.0–8.0)

## 2018-05-02 LAB — POC MICROSCOPIC URINALYSIS (UMFC): Mucus: ABSENT

## 2018-05-02 NOTE — Progress Notes (Signed)
11/29/20199:35 AM  Cassandra ReeveKhadija Mitchell 01/14/1971, 47 y.o. female [id number]  Chief Complaint  Patient presents with  . Hematuria    follow up with CT scan    HPI:   Patient is a 47 y.o. female who presents today for followup on hematuria  Incidental finding on CPE done Apr 15 2018 She was also found to have an enlarged uterus She was told to come back for repeat urine in 2 weeks Has pelvic US done as well - normal  Patient denies any sx for kidney stones, UTI or ever pink, dark urine. Denies any clots Denies any menses or vaginal spotting  Lab Results  Component Value Date   CREATININE 0.63 04/15/2018    Fall Risk  05/02/2018 04/15/2018 02/05/2017 02/01/2017 10/10/2016  Falls in the past year? 0 1 No No No     [facility] 05/02/2018 04/15/2018 02/05/2017  Decreased Interest 0 0 0  Down, Depressed, Hopeless 0 0 0  PHQ - 2 Score 0 0 0    No Known Allergies  Prior to Admission medications   Medication Sig Start Date End Date Taking? Authorizing Provider  cetirizine (ZYRTEC) 10 MG tablet Take 1 tablet (10 mg total) by mouth daily. 04/15/18  Yes Benjiman CoreWiseman, Brittany D, PA-C  etonogestrel (NEXPLANON) 68 MG IMPL implant 1 each by Subdermal route once.   Yes [provider]  fluticasone (FLONASE) 50 MCG/ACT nasal spray Place 2 sprays into both nostrils daily. 04/15/18  Yes Benjiman CoreWiseman, Brittany D, PA-C    History reviewed. No pertinent past medical history.  Past Surgical History:  Procedure Laterality Date  . DILATION AND EVACUATION N/A 05/08/2013   Procedure: DILATATION AND EVACUATION;  Surgeon: Antionette CharLisa Jackson-Moore, MD;  Location: [facility];  Service: Gynecology;  Laterality: N/A;    Social History   Tobacco Use  . Smoking status: Never Smoker  . Smokeless tobacco: Never Used  Substance Use Topics  . Alcohol use: No    Family History  Problem Relation Age of Onset  . Hypertension Mother   . Asthma Father   . Diabetes Maternal Grandmother   . Hypertension  Maternal Grandmother     ROS Per hpi  OBJECTIVE:  Blood pressure 116/76, pulse 67, temperature 98.1 F (36.7 C), temperature source Oral, height 4' 10.5" (1.486 m), weight 158 lb 9.6 oz (71.9 kg), SpO2 100 %. Body mass index is 32.58 kg/m.   Physical Exam  Constitutional: She is oriented to person, place, and time. She appears well-developed and well-nourished.  HENT:  Head: Normocephalic and atraumatic.  Mouth/Throat: Mucous membranes are normal.  Eyes: Pupils are equal, round, and reactive to light. Conjunctivae and EOM are normal. No scleral icterus.  Neck: Neck supple.  Pulmonary/Chest: Effort normal.  Neurological: She is alert and oriented to person, place, and time.  Skin: Skin is warm and dry.  Psychiatric: She has a normal mood and affect.  Nursing note and vitals reviewed.   Results for orders placed or performed in visit on 05/02/18 (from the past 24 hour(s))  POCT urinalysis dipstick     Status: Abnormal   Collection Time: 05/02/18  9:16 AM  Result Value Ref Range   Color, UA yellow yellow   Clarity, UA clear clear   Glucose, UA negative negative mg/dL   Bilirubin, UA negative negative   Ketones, POC UA negative negative mg/dL   Spec Grav, UA [id number] - 1.025   Blood, UA moderate (A) negative   pH, UA 5.0 5.0 -  8.0   Protein Ur, POC negative negative mg/dL   Urobilinogen, UA 0.2 0.2 or 1.0 E.U./dL   Nitrite, UA Negative Negative   Leukocytes, UA Negative Negative  POCT Microscopic Urinalysis ([facility])     Status: Abnormal   Collection Time: 05/02/18  9:53 AM  Result Value Ref Range   WBC,UR,HPF,POC None None WBC/hpf   RBC,UR,HPF,POC Few (A) None RBC/hpf   Bacteria None None, Too numerous to count   Mucus Absent Absent   Epithelial Cells, UR Per Microscopy Few (A) None, Too numerous to count cells/hpf    ASSESSMENT and PLAN  1. Asymptomatic microscopic hematuria - POCT urinalysis dipstick - POCT Microscopic Urinalysis ([facility]) - Urine Culture - US  Renal; Future - Ambulatory referral to Urology  Return if symptoms worsen or fail to improve.    Myles Lipps, MD Primary Care at [facility] [address] [place], [place] [address] Ph.  [phone] Fax [phone]

## 2018-05-03 LAB — URINE CULTURE

## 2018-05-13 ENCOUNTER — Ambulatory Visit
Admission: RE | Admit: 2018-05-13 | Discharge: 2018-05-13 | Disposition: A | Discharge status: Home / Self Care | Payer: 59 | Source: Ambulatory Visit | Attending: Family Medicine | Admitting: Family Medicine

## 2018-05-13 DIAGNOSIS — R3121 Asymptomatic microscopic hematuria: Secondary | ICD-10-CM

## 2019-06-19 ENCOUNTER — Other Ambulatory Visit: Payer: Self-pay

## 2019-06-19 ENCOUNTER — Ambulatory Visit (INDEPENDENT_AMBULATORY_CARE_PROVIDER_SITE_OTHER): Payer: 59 | Admitting: Family Medicine

## 2019-06-19 ENCOUNTER — Encounter: Payer: Self-pay | Admitting: Family Medicine

## 2019-06-19 VITALS — BP 108/67 | HR 64 | Temp 98.0°F | Ht 58.5 in | Wt 155.0 lb

## 2019-06-19 DIAGNOSIS — Z13220 Encounter for screening for lipoid disorders: Secondary | ICD-10-CM | POA: Diagnosis not present

## 2019-06-19 DIAGNOSIS — Z23 Encounter for immunization: Secondary | ICD-10-CM

## 2019-06-19 DIAGNOSIS — N898 Other specified noninflammatory disorders of vagina: Secondary | ICD-10-CM | POA: Diagnosis not present

## 2019-06-19 DIAGNOSIS — Z0000 Encounter for general adult medical examination without abnormal findings: Secondary | ICD-10-CM

## 2019-06-19 DIAGNOSIS — B373 Candidiasis of vulva and vagina: Secondary | ICD-10-CM

## 2019-06-19 DIAGNOSIS — Z130 Encounter for screening for diseases of the blood and blood-forming organs and certain disorders involving the immune mechanism: Secondary | ICD-10-CM | POA: Diagnosis not present

## 2019-06-19 DIAGNOSIS — Z1329 Encounter for screening for other suspected endocrine disorder: Secondary | ICD-10-CM

## 2019-06-19 DIAGNOSIS — B3731 Acute candidiasis of vulva and vagina: Secondary | ICD-10-CM

## 2019-06-19 DIAGNOSIS — Z0001 Encounter for general adult medical examination with abnormal findings: Secondary | ICD-10-CM | POA: Diagnosis not present

## 2019-06-19 DIAGNOSIS — Z13228 Encounter for screening for other metabolic disorders: Secondary | ICD-10-CM

## 2019-06-19 LAB — POCT WET + KOH PREP: Trich by wet prep: ABSENT

## 2019-06-19 MED ORDER — FLUCONAZOLE 150 MG PO TABS: 150.0000 mg | ORAL_TABLET | Freq: Once | ORAL | 0 refills | Status: AC

## 2019-06-19 NOTE — Patient Instructions (Addendum)
   If you have lab work done today you will be contacted with your lab results within the next 2 weeks.  If you have not heard from us then please contact us. The fastest way to get your results is to register for My Chart.   IF you received an x-ray today, you will receive an invoice from [facility]. Please contact [facility] at [phone] with questions or concerns regarding your invoice.   IF you received labwork today, you will receive an invoice from [facility]. Please contact [facility] at [phone] with questions or concerns regarding your invoice.   Our billing staff will not be able to assist you with questions regarding bills from these companies.  You will be contacted with the lab results as soon as they are available. The fastest way to get your results is to activate your My Chart account. Instructions are located on the last page of this paperwork. If you have not heard from us regarding the results in 2 weeks, please contact this office.     Preventive Care 40-64 Years Old, Female Preventive care refers to visits with your health care provider and lifestyle choices that can promote health and wellness. This includes:  A yearly physical exam. This may also be called an annual well check.  Regular dental visits and eye exams.  Immunizations.  Screening for certain conditions.  Healthy lifestyle choices, such as eating a healthy diet, getting regular exercise, not using drugs or products that contain nicotine and tobacco, and limiting alcohol use. What can I expect for my preventive care visit? Physical exam Your health care provider will check your:  Height and weight. This may be used to calculate body mass index (BMI), which tells if you are at a healthy weight.  Heart rate and blood pressure.  Skin for abnormal spots. Counseling Your health care provider may ask you questions about your:  Alcohol, tobacco, and drug use.  Emotional  well-being.  Home and relationship well-being.  Sexual activity.  Eating habits.  Work and work environment.  Method of birth control.  Menstrual cycle.  Pregnancy history. What immunizations do I need?  Influenza (flu) vaccine  This is recommended every year. Tetanus, diphtheria, and pertussis (Tdap) vaccine  You may need a Td booster every 10 years. Varicella (chickenpox) vaccine  You may need this if you have not been vaccinated. Zoster (shingles) vaccine  You may need this after age 60. Measles, mumps, and rubella (MMR) vaccine  You may need at least one dose of MMR if you were born in 1957 or later. You may also need a second dose. Pneumococcal conjugate (PCV13) vaccine  You may need this if you have certain conditions and were not previously vaccinated. Pneumococcal polysaccharide (PPSV23) vaccine  You may need one or two doses if you smoke cigarettes or if you have certain conditions. Meningococcal conjugate (MenACWY) vaccine  You may need this if you have certain conditions. Hepatitis A vaccine  You may need this if you have certain conditions or if you travel or work in places where you may be exposed to hepatitis A. Hepatitis B vaccine  You may need this if you have certain conditions or if you travel or work in places where you may be exposed to hepatitis B. Haemophilus influenzae type b (Hib) vaccine  You may need this if you have certain conditions. Human papillomavirus (HPV) vaccine  If recommended by your health care provider, you may need three doses over 6 months.   You may receive vaccines as individual doses or as more than one vaccine together in one shot (combination vaccines). Talk with your health care provider about the risks and benefits of combination vaccines. What tests do I need? Blood tests  Lipid and cholesterol levels. These may be checked every 5 years, or more frequently if you are over 50 years old.  Hepatitis C  test.  Hepatitis B test. Screening  Lung cancer screening. You may have this screening every year starting at age 55 if you have a 30-pack-year history of smoking and currently smoke or have quit within the past 15 years.  Colorectal cancer screening. All adults should have this screening starting at age 50 and continuing until age 75. Your health care provider may recommend screening at age 45 if you are at increased risk. You will have tests every 1-10 years, depending on your results and the type of screening test.  Diabetes screening. This is done by checking your blood sugar (glucose) after you have not eaten for a while (fasting). You may have this done every 1-3 years.  Mammogram. This may be done every 1-2 years. Talk with your health care provider about when you should start having regular mammograms. This may depend on whether you have a family history of breast cancer.  BRCA-related cancer screening. This may be done if you have a family history of breast, ovarian, tubal, or peritoneal cancers.  Pelvic exam and Pap test. This may be done every 3 years starting at age 21. Starting at age 30, this may be done every 5 years if you have a Pap test in combination with an HPV test. Other tests  Sexually transmitted disease (STD) testing.  Bone density scan. This is done to screen for osteoporosis. You may have this scan if you are at high risk for osteoporosis. Follow these instructions at home: Eating and drinking  Eat a diet that includes fresh fruits and vegetables, whole grains, lean protein, and low-fat dairy.  Take vitamin and mineral supplements as recommended by your health care provider.  Do not drink alcohol if: ? Your health care provider tells you not to drink. ? You are pregnant, may be pregnant, or are planning to become pregnant.  If you drink alcohol: ? Limit how much you have to 0-1 drink a day. ? Be aware of how much alcohol is in your drink. In the [place] one  drink equals one 12 oz bottle of beer (355 mL), one 5 oz glass of wine (148 mL), or one 1 oz glass of hard liquor (44 mL). Lifestyle  Take daily care of your teeth and gums.  Stay active. Exercise for at least 30 minutes on 5 or more days each week.  Do not use any products that contain nicotine or tobacco, such as cigarettes, e-cigarettes, and chewing tobacco. If you need help quitting, ask your health care provider.  If you are sexually active, practice safe sex. Use a condom or other form of birth control (contraception) in order to prevent pregnancy and STIs (sexually transmitted infections).  If told by your health care provider, take low-dose aspirin daily starting at age 50. What's next?  Visit your health care provider once a year for a well check visit.  Ask your health care provider how often you should have your eyes and teeth checked.  Stay up to date on all vaccines. This information is not intended to replace advice given to you by your health care provider. Make sure   you discuss any questions you have with your health care provider. Document Revised: 01/30/2018 Document Reviewed: 01/30/2018 Elsevier Patient Education  2020 [employer]  

## 2019-06-19 NOTE — Progress Notes (Signed)
1/15/20213:16 PM  [name] [birth year], 49 y.o., female [id number]  Chief Complaint  Patient presents with  . Annual Exam    HPI:   Patient is a 49 y.o. female who presents today for CPE  Cervical Cancer Screening: nov 2019, neg pap and hpv, denies h/o abnormal  Breast Cancer Screening: at age 63 Colorectal Cancer Screening: at age 12 Bone Density Testing: at age 89 HIV Screening: declines STI Screening: declines Frequency of Dental evaluation: Q6 months Frequency of Eye evaluation: yearly fasting   Hearing Screening   125Hz  250Hz  500Hz  1000Hz  2000Hz  3000Hz  4000Hz  6000Hz  8000Hz   Right ear:           Left ear:             Visual Acuity Screening   Right eye Left eye Both eyes  Without correction: 20/30 20/70 20/25   With correction:       Most Recent Immunizations  Administered Date(s) Administered  . Influenza,inj,Quad PF,6+ Mos 06/18/2019  . Tdap 02/09/2013   There are no preventive care reminders to display for this patient.  Depression screen [facility] 2/9 06/19/2019 05/02/2018 04/15/2018  Decreased Interest 0 0 0  Down, Depressed, Hopeless 0 0 0  PHQ - 2 Score 0 0 0    Fall Risk  06/19/2019 05/02/2018 04/15/2018 02/05/2017 02/01/2017  Falls in the past year? 0 0 1 No No  Number falls in past yr: 0 - - - -  Injury with Fall? 0 - - - -     No Known Allergies  Prior to Admission medications   Medication Sig Start Date End Date Taking? Authorizing Provider  cetirizine (ZYRTEC) 10 MG tablet Take 1 tablet (10 mg total) by mouth daily. 04/15/18  Yes Tenna Delaine D, PA-C  etonogestrel (NEXPLANON) 68 MG IMPL implant 1 each by Subdermal route once.   Yes [provider]  fluticasone (FLONASE) 50 MCG/ACT nasal spray Place 2 sprays into both nostrils daily. 04/15/18  Yes Tenna Delaine D, PA-C    History reviewed. No pertinent past medical history.  Past Surgical History:  Procedure Laterality Date  . DILATION AND EVACUATION N/A 05/08/2013   Procedure: DILATATION AND EVACUATION;  Surgeon: Lahoma Crocker, MD;  Location: [facility];  Service: Gynecology;  Laterality: N/A;    Social History   Tobacco Use  . Smoking status: Never Smoker  . Smokeless tobacco: Never Used  Substance Use Topics  . Alcohol use: No    Family History  Problem Relation Age of Onset  . Hypertension Mother   . Asthma Father   . Diabetes Maternal Grandmother   . Hypertension Maternal Grandmother     Review of Systems  Constitutional: Negative for chills and fever.  Respiratory: Negative for cough and shortness of breath.   Cardiovascular: Negative for chest pain, palpitations and leg swelling.  Gastrointestinal: Negative for abdominal pain, blood in stool, constipation, diarrhea, melena, nausea and vomiting.  Genitourinary: Negative for dysuria, frequency, hematuria and urgency.       Neg breast lumps or nipple discharge Neg pelvic pain, dyspareunia Having vaginal itchiness with white chunky discharge for past several days nexplanon to be changed by obgyn later this year  Neurological: Negative for dizziness and headaches.  Endo/Heme/Allergies: Negative for polydipsia.  Psychiatric/Behavioral: Negative for depression. The patient is not nervous/anxious and does not have insomnia.   All other systems reviewed and are negative.    OBJECTIVE:  Today's Vitals   06/19/19 1505  BP: 108/67  Pulse: 64  Temp:  98 F (36.7 C)  SpO2: 100%  Weight: 155 lb (70.3 kg)  Height: 4' 10.5" (1.486 m)   Body mass index is 31.84 kg/m.  Wt Readings from Last 3 Encounters:  06/19/19 155 lb (70.3 kg)  05/02/18 158 lb 9.6 oz (71.9 kg)  04/15/18 157 lb 6.4 oz (71.4 kg)    Physical Exam Vitals and nursing note reviewed.  Constitutional:      Appearance: She is well-developed.  HENT:     Head: Normocephalic and atraumatic.     Right Ear: Hearing, tympanic membrane, ear canal and external ear normal.     Left Ear: Hearing, tympanic membrane, ear canal  and external ear normal.     Mouth/Throat:     Mouth: Mucous membranes are moist.     Pharynx: No oropharyngeal exudate or posterior oropharyngeal erythema.  Eyes:     Extraocular Movements: Extraocular movements intact.     Conjunctiva/sclera: Conjunctivae normal.     Pupils: Pupils are equal, round, and reactive to light.  Neck:     Thyroid: No thyromegaly.  Cardiovascular:     Rate and Rhythm: Normal rate and regular rhythm.     Heart sounds: Normal heart sounds. No murmur. No friction rub. No gallop.   Pulmonary:     Effort: Pulmonary effort is normal.     Breath sounds: Normal breath sounds. No wheezing, rhonchi or rales.  Abdominal:     General: Bowel sounds are normal. There is no distension.     Palpations: Abdomen is soft. There is no hepatomegaly, splenomegaly or mass.     Tenderness: There is no abdominal tenderness.  Musculoskeletal:        General: Normal range of motion.     Cervical back: Neck supple.     Right lower leg: No edema.     Left lower leg: No edema.  Lymphadenopathy:     Cervical: No cervical adenopathy.  Skin:    General: Skin is warm and dry.  Neurological:     Mental Status: She is alert and oriented to person, place, and time.     Cranial Nerves: No cranial nerve deficit.     Gait: Gait normal.     Deep Tendon Reflexes: Reflexes are normal and symmetric.  Psychiatric:        Mood and Affect: Mood normal.        Behavior: Behavior normal.     Results for orders placed or performed in visit on 06/19/19 (from the past 24 hour(s))  POCT Wet + KOH Prep ([facility])     Status: Abnormal   Collection Time: 06/19/19  3:49 PM  Result Value Ref Range   Yeast by KOH Present (A) Absent   Yeast by wet prep Present (A) Absent   WBC by wet prep Few Few   Clue Cells Wet Prep HPF POC Few (A) None   Trich by wet prep Absent Absent   Bacteria Wet Prep HPF POC Moderate (A) Few   Epithelial Cells By [employer] Pref ([facility]) Moderate (A) None, Few, Too numerous to count    RBC,UR,HPF,POC None None RBC/hpf    No results found.   ASSESSMENT and PLAN  1. Annual physical exam No concerns per history or exam. Routine HCM labs ordered. HCM reviewed/discussed. Anticipatory guidance regarding healthy weight, lifestyle and choices given.   2. Screening for metabolic disorder - CMET with GFR  3. Screening for deficiency anemia - CBC  4. Screening for lipid disorders - Lipid panel  5. Screening for thyroid disorder - TSH  6. Need for influenza vaccination  7. Vaginal yeast infection rx diflucan   8. Vaginal discharge - POCT Wet + KOH Prep ([facility])  Other orders - Flu Vaccine - fluconazole (DIFLUCAN) 150 MG tablet; Take 1 tablet (150 mg total) by mouth once for 1 dose.  Return in about 1 year (around 06/18/2020).    Myles Lipps, MD Primary Care at [facility] [address] [place], [place] [address] Ph.  [phone] Fax [phone]

## 2019-06-20 LAB — CMP14+EGFR
ALT: 14 IU/L (ref 0–32)
AST: 17 IU/L (ref 0–40)
Albumin/Globulin Ratio: 1.7 (ref 1.2–2.2)
Albumin: 4.4 g/dL (ref 3.8–4.8)
Alkaline Phosphatase: 48 IU/L (ref 39–117)
BUN/Creatinine Ratio: 19 (ref 9–23)
BUN: 10 mg/dL (ref 6–24)
Bilirubin Total: 0.3 mg/dL (ref 0.0–1.2)
CO2: 23 mmol/L (ref 20–29)
Calcium: 9.2 mg/dL (ref 8.7–10.2)
Chloride: 103 mmol/L (ref 96–106)
Creatinine, Ser: 0.54 mg/dL — ABNORMAL LOW (ref 0.57–1.00)
GFR calc Af Amer: 130 mL/min/{1.73_m2} (ref >59)
GFR calc non Af Amer: 113 mL/min/{1.73_m2} (ref >59)
Globulin, Total: 2.6 g/dL (ref 1.5–4.5)
Glucose: 79 mg/dL (ref 65–99)
Potassium: 4.1 mmol/L (ref 3.5–5.2)
Sodium: 139 mmol/L (ref 134–144)
Total Protein: 7 g/dL (ref 6.0–8.5)

## 2019-06-20 LAB — LIPID PANEL
Chol/HDL Ratio: 3.7 ratio (ref 0.0–4.4)
Cholesterol, Total: 151 mg/dL (ref 100–199)
HDL: 41 mg/dL (ref >39)
LDL Chol Calc (NIH): 94 mg/dL (ref 0–99)
Triglycerides: 84 mg/dL (ref 0–149)
VLDL Cholesterol Cal: 16 mg/dL (ref 5–40)

## 2019-06-20 LAB — CBC
Hematocrit: 42.5 % (ref 34.0–46.6)
Hemoglobin: 14.1 g/dL (ref 11.1–15.9)
MCH: 28 pg (ref 26.6–33.0)
MCHC: 33.2 g/dL (ref 31.5–35.7)
MCV: 84 fL (ref 79–97)
Platelets: 243 10*3/uL (ref 150–450)
RBC: 5.04 x10E6/uL (ref 3.77–5.28)
RDW: 13.1 % (ref 11.7–15.4)
WBC: 5.1 10*3/uL (ref 3.4–10.8)

## 2019-06-20 LAB — TSH: TSH: 1.42 u[IU]/mL (ref 0.450–4.500)

## 2019-07-03 ENCOUNTER — Encounter: Payer: Self-pay | Admitting: Radiology

## 2019-10-05 ENCOUNTER — Other Ambulatory Visit: Payer: Self-pay

## 2019-10-05 ENCOUNTER — Encounter: Payer: Self-pay | Admitting: Family Medicine

## 2019-10-05 ENCOUNTER — Ambulatory Visit (INDEPENDENT_AMBULATORY_CARE_PROVIDER_SITE_OTHER): Payer: 59 | Admitting: Family Medicine

## 2019-10-05 VITALS — BP 125/60 | HR 58 | Temp 97.5°F | Ht 58.5 in | Wt 152.0 lb

## 2019-10-05 DIAGNOSIS — Z3046 Encounter for surveillance of implantable subdermal contraceptive: Secondary | ICD-10-CM

## 2019-10-05 MED ORDER — LIDOCAINE HCL (PF) 1 % IJ SOLN
1.0000 mL | Freq: Once | INTRAMUSCULAR | Status: AC
  Administered 2019-10-05: 1 mL via INTRADERMAL

## 2019-10-05 NOTE — Progress Notes (Signed)
5/3/20213:26 PM  [name] 10/15/1970, 49 y.o., female [id number]  Chief Complaint  Patient presents with  . nexplanon removal    HPI:   Patient is a 49 y.o. female who presents today for nexplanon removal  nexplanon inserted into left arm 3 years ago She would like it removed and does not desire any other form of contraception at this time She has no other concerns today  Depression screen [facility] 2/9 10/05/2019 06/19/2019 05/02/2018  Decreased Interest 0 0 0  Down, Depressed, Hopeless 0 0 0  PHQ - 2 Score 0 0 0    Fall Risk  10/05/2019 06/19/2019 05/02/2018 04/15/2018 02/05/2017  Falls in the past year? 0 0 0 1 No  Number falls in past yr: 0 0 - - -  Injury with Fall? 0 0 - - -  Follow up Falls evaluation completed - - - -     No Known Allergies  Prior to Admission medications   Medication Sig Start Date End Date Taking? Authorizing Provider  cetirizine (ZYRTEC) 10 MG tablet Take 1 tablet (10 mg total) by mouth daily. 04/15/18  Yes Benjiman Core D, PA-C  etonogestrel (NEXPLANON) 68 MG IMPL implant 1 each by Subdermal route once.   Yes [provider]  fluticasone (FLONASE) 50 MCG/ACT nasal spray Place 2 sprays into both nostrils daily. 04/15/18  Yes Benjiman Core D, PA-C    No past medical history on file.  Past Surgical History:  Procedure Laterality Date  . DILATION AND EVACUATION N/A 05/08/2013   Procedure: DILATATION AND EVACUATION;  Surgeon: Antionette Char, MD;  Location: [facility];  Service: Gynecology;  Laterality: N/A;    Social History   Tobacco Use  . Smoking status: Never Smoker  . Smokeless tobacco: Never Used  Substance Use Topics  . Alcohol use: No    Family History  Problem Relation Age of Onset  . Hypertension Mother   . Asthma Father   . Diabetes Maternal Grandmother   . Hypertension Maternal Grandmother     ROS Per hpi  OBJECTIVE:  Today's Vitals   10/05/19 1510  BP: 125/60  Pulse: (!) 58  Temp: (!) 97.5 F (36.4  C)  SpO2: 99%  Weight: 152 lb (68.9 kg)  Height: 4' 10.5" (1.486 m)   Body mass index is 31.23 kg/m.   Physical Exam   Gen: AAOx3, NAD  Nexplanon Removal:   Patient given informed consent for removal of her nexplanon, time out was performed.  Signed copy in the chart. Nexplanon site identified.  Area prepped in usual sterile fashon. One cc of 1% lidocaine was used to anesthetize the area at the distal end of the implant. A small stab incision was made right beside the implant on the distal portion.  The implanon rod was grasped using hemostats and removed without difficulty.  There was less than 3 cc blood loss. There were no complications. Incision was cleansed and steri-strips were applied.  A pressure bandage was applied to reduce any bruising.  The patient tolerated the procedure well and was given post procedure instructions.   No results found for this or any previous visit (from the past 24 hour(s)).  No results found.   ASSESSMENT and PLAN  1. Nexplanon removal Removed wo difficulties. Routine care and precautions given.  Patient denies any further contraception at this time.  Return if symptoms worsen or fail to improve.    Myles Lipps, MD Primary Care at [facility] [address] [place], [place]  [address] Ph.  [phone] Fax [phone]

## 2019-10-05 NOTE — Patient Instructions (Addendum)
  Please keep pressure dressing on for 24 hours. Wash area normally with water and soap. Keep dry. Steri stripe will eventually fall on its own. Return to clinic for any abnormal bleeding or signs of infection.   If you have lab work done today you will be contacted with your lab results within the next 2 weeks.  If you have not heard from [place] then please contact us. The fastest way to get your results is to register for My Chart.   IF you received an x-ray today, you will receive an invoice from [facility]. Please contact [facility] at [phone] with questions or concerns regarding your invoice.   IF you received labwork today, you will receive an invoice from [place]. Please contact [facility] at [phone] with questions or concerns regarding your invoice.   Our billing staff will not be able to assist you with questions regarding bills from these companies.  You will be contacted with the lab results as soon as they are available. The fastest way to get your results is to activate your My Chart account. Instructions are located on the last page of this paperwork. If you have not heard from [place] regarding the results in 2 weeks, please contact this office.

## 2019-10-05 NOTE — Addendum Note (Signed)
Addended by: Alm Bustard R on: 10/05/2019 04:41 PM   Modules accepted: Orders

## 2019-10-22 IMAGING — US US PELVIS COMPLETE TRANSABD/TRANSVAG
1 series · 13 of 25 positions shown · non-contrast
Comparison: None

CLINICAL DATA: Initial evaluation for enlarged uterus on physical
exam.



[Series 1: us pelvis complete transabd/transvag · 0.26mm/px · 13 of 53 slices shown]
[im 1/53]
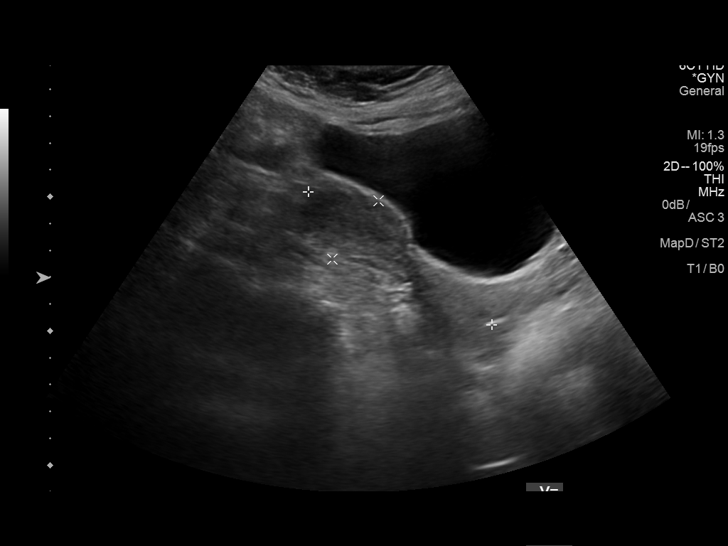
[im 5/53]
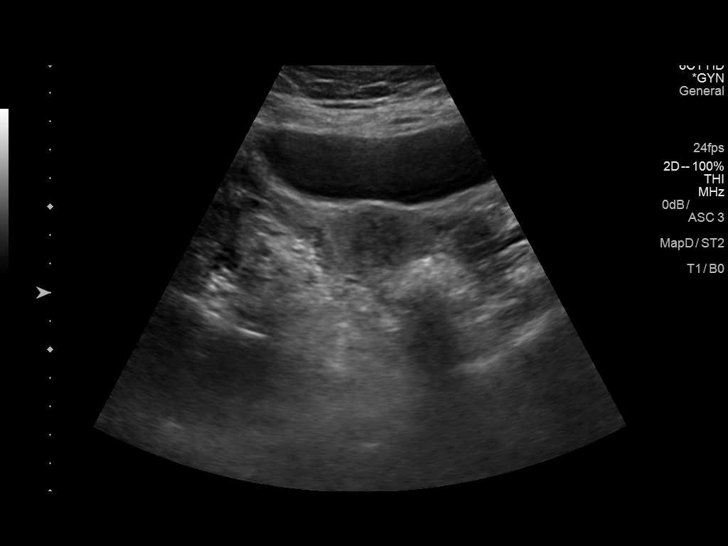
[im 9/53]
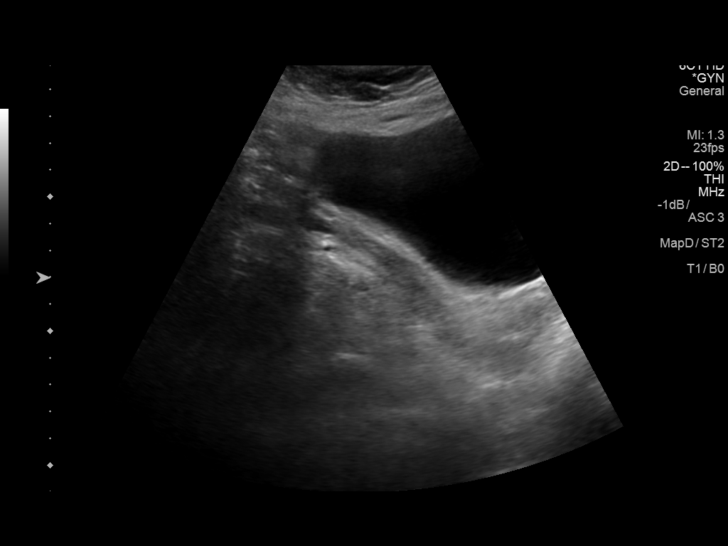
[im 14/53]
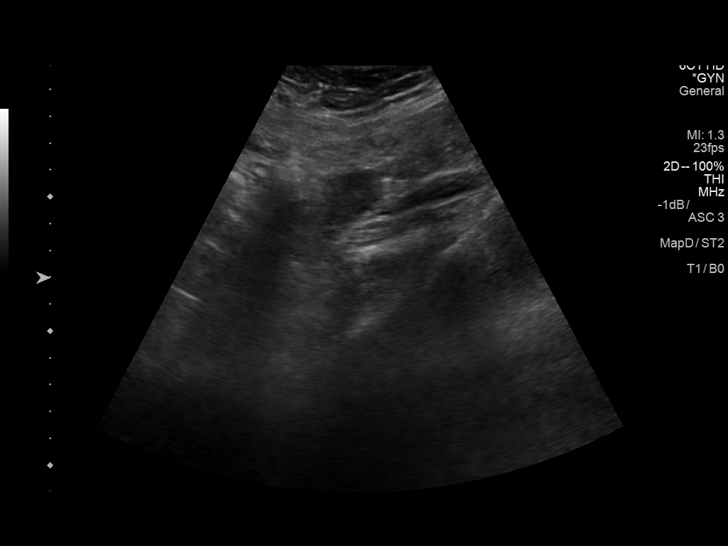
[im 18/53]
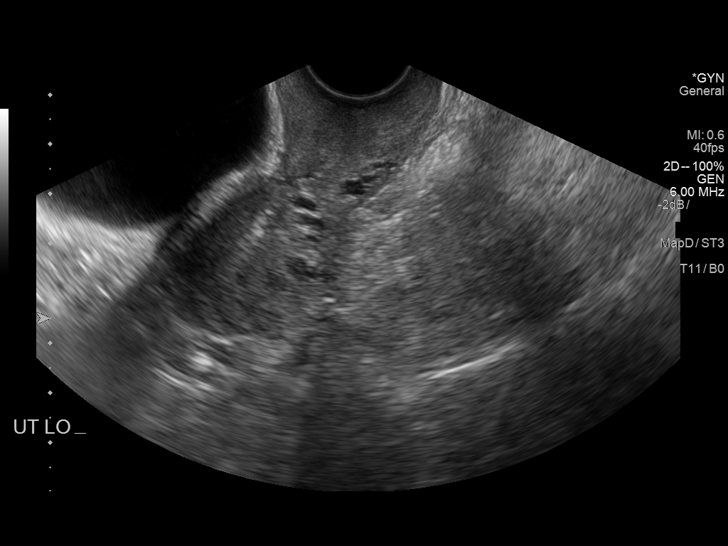
[im 22/53]
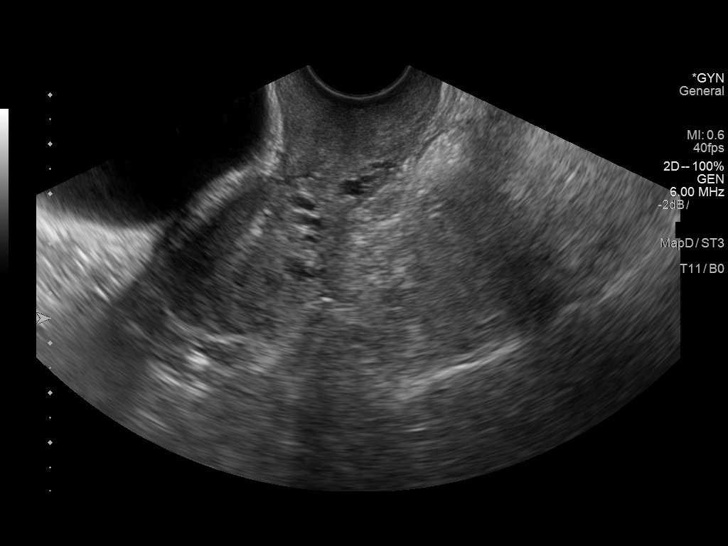
[im 27/53]
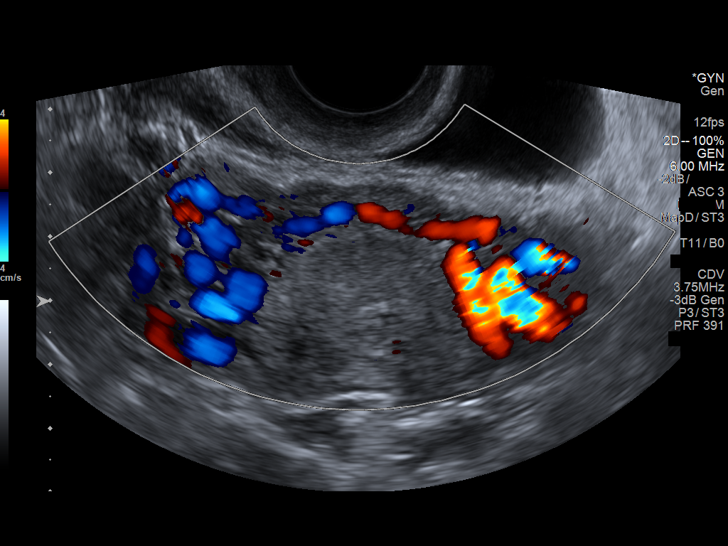
[im 31/53]
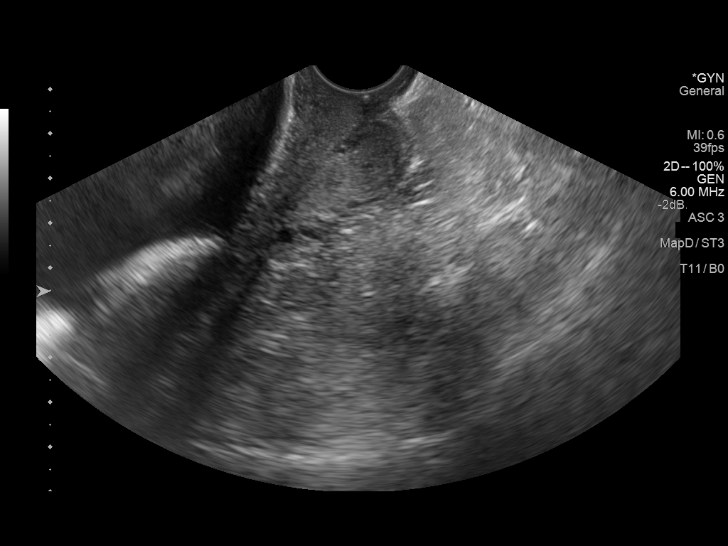
[im 35/53]
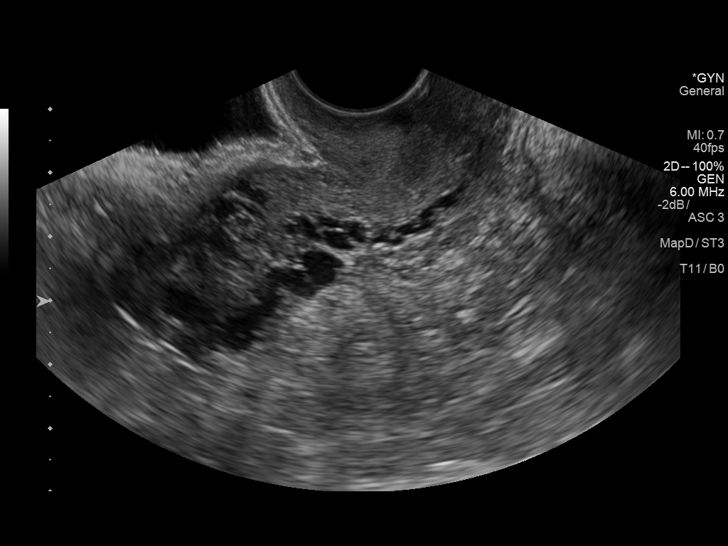
[im 40/53]
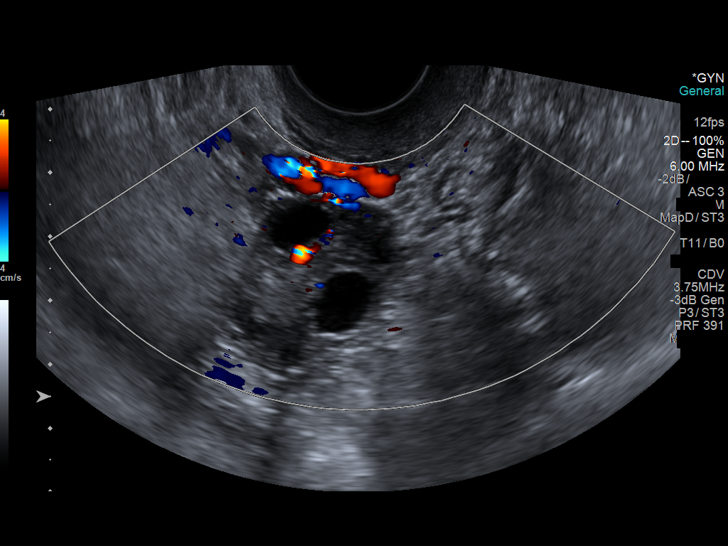
[im 44/53]
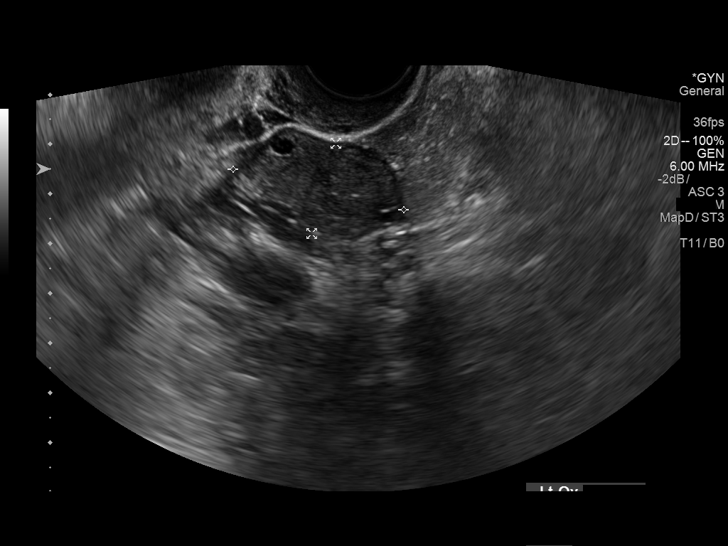
[im 48/53]
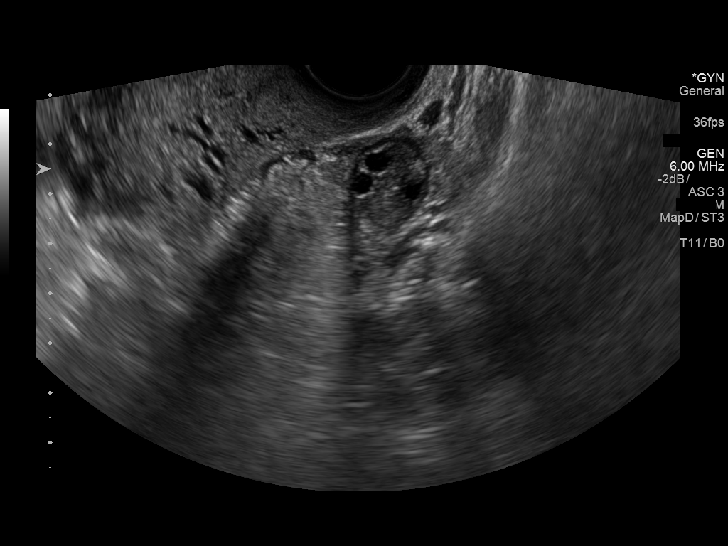
[im 53/53]
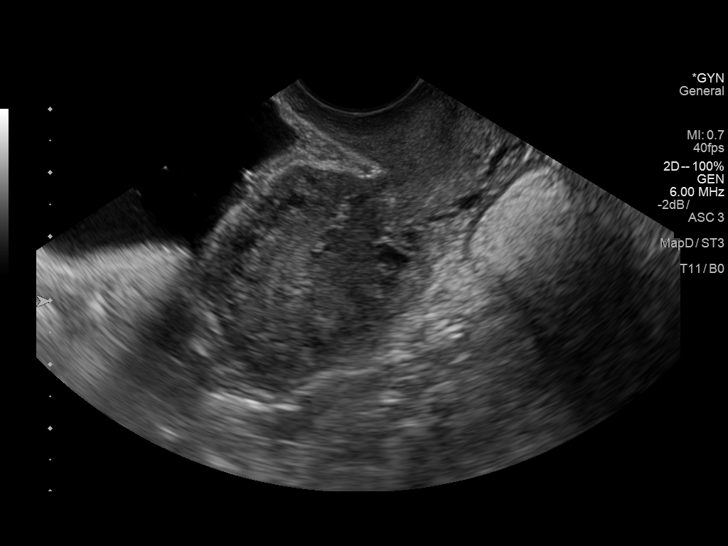

[13 of 25 positions shown; findings below may reference images not displayed]

FINDINGS: Uterus

Measurements: 8.4 x 2.8 x 4.0 cm = volume: 49.1 mL. Uterus is
anteverted. No fibroids or other mass visualized. Mild prominence of
the arcuate vessels noted.

Endometrium

Thickness: 7.3 mm.  No focal abnormality visualized.

Right ovary

Measurements: 3.1 x 2.5 x 2.7 cm = volume: 10.9 mL. Normal
appearance/no adnexal mass.

Left ovary

Measurements: 3.5 x 1.9 x 1.6 cm = volume: 5.5 mL. Normal
appearance/no adnexal mass.

Other findings

No abnormal free fluid.
IMPRESSION: 1. Negative pelvic ultrasound with normal sonographic appearance of
the uterus. No fibroid or other uterine mass identified.
2. Normal ovaries.  No adnexal mass.

## 2019-11-16 IMAGING — US US RENAL
1 series · 14 of 25 positions shown · non-contrast
Comparison: None.

CLINICAL DATA: Microscopic hematuria

EXAM:
RENAL / URINARY TRACT ULTRASOUND COMPLETE

[Series 1: us renal · 0.19mm/px · 14 of 28 slices shown]
[im 1/28]
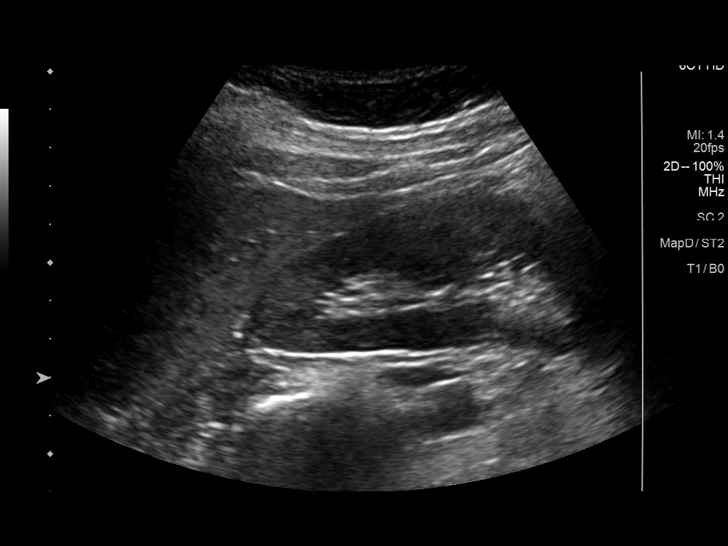
[im 3/28]
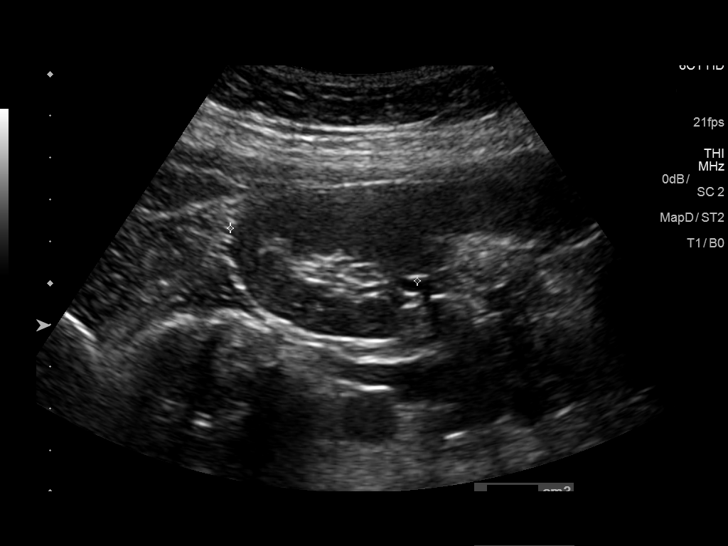
[im 5/28]
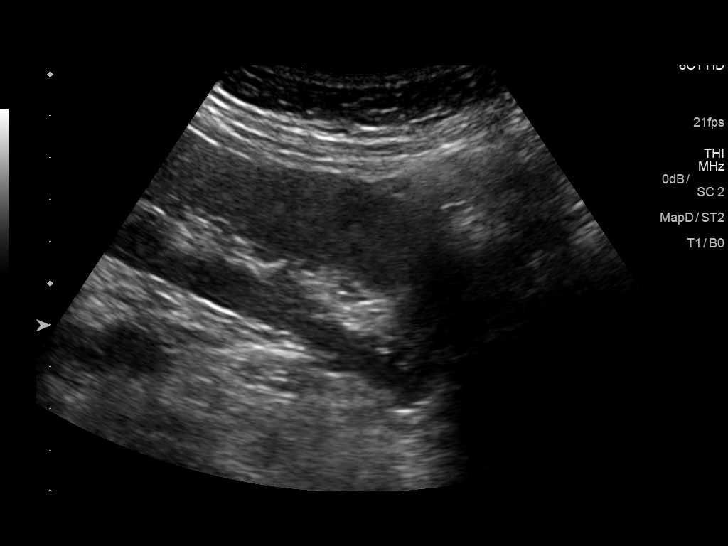
[im 7/28]
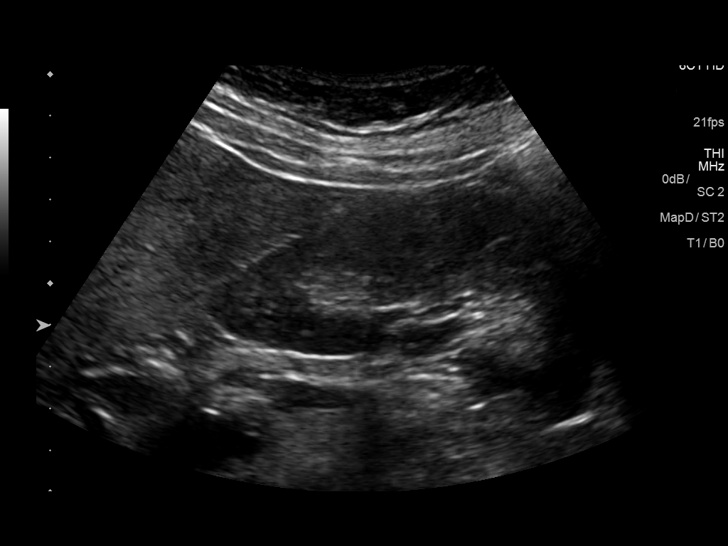
[im 10/28]
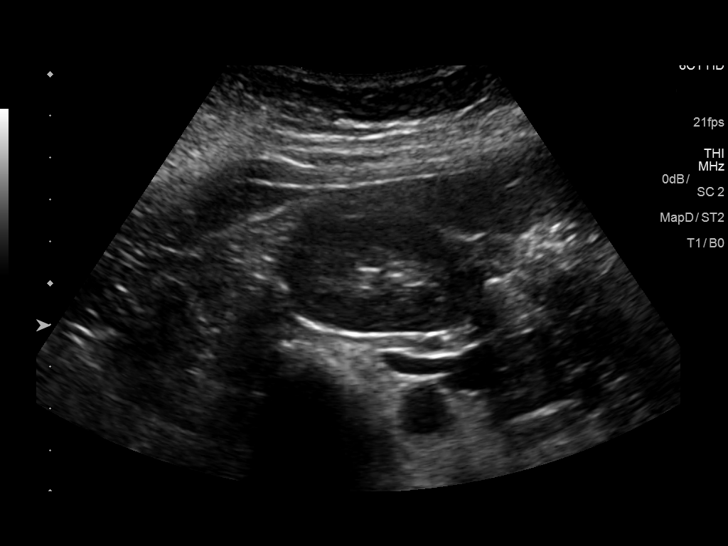
[im 11/28]
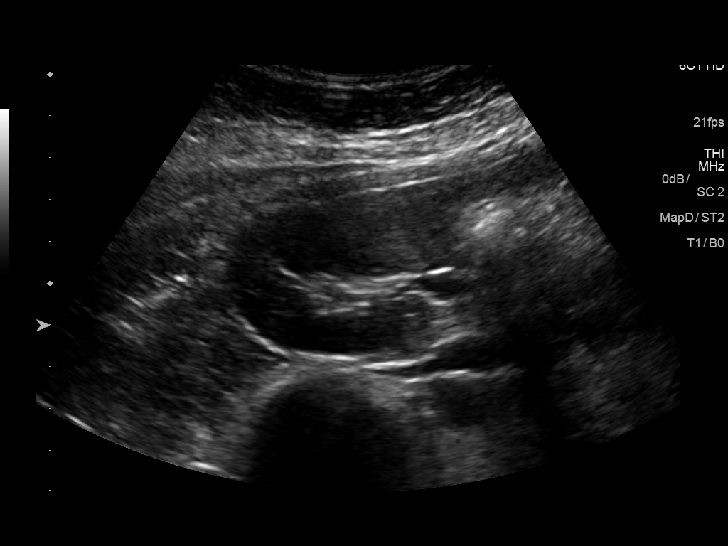
[im 13/28]
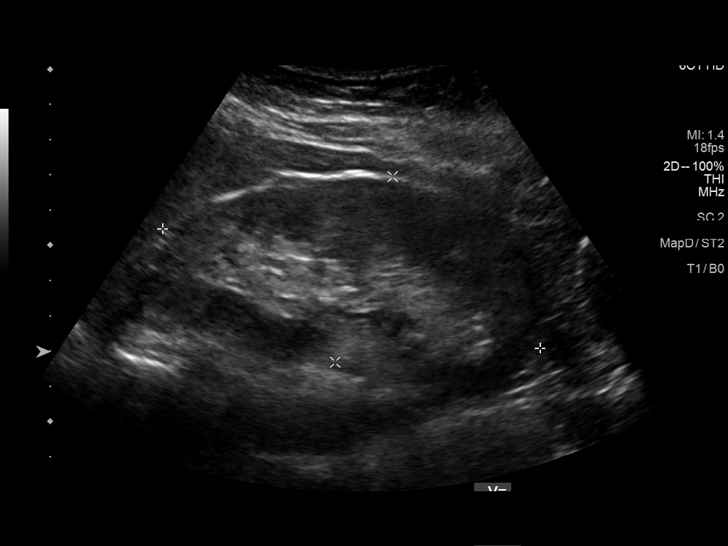
[im 15/28]
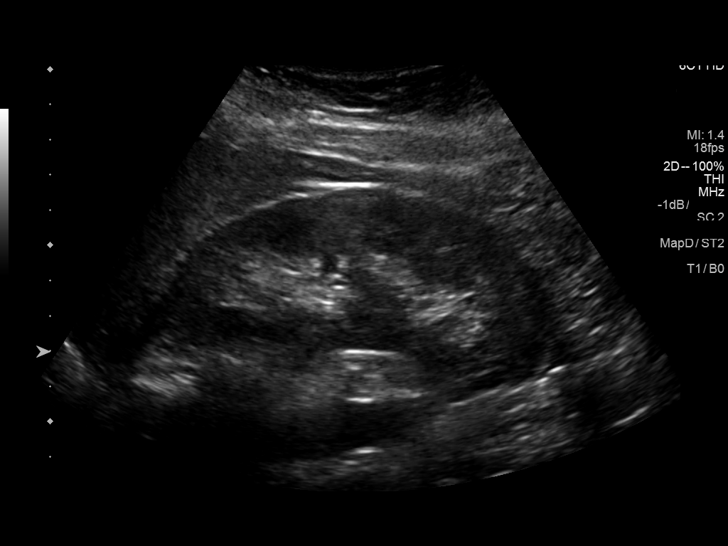
[im 17/28]
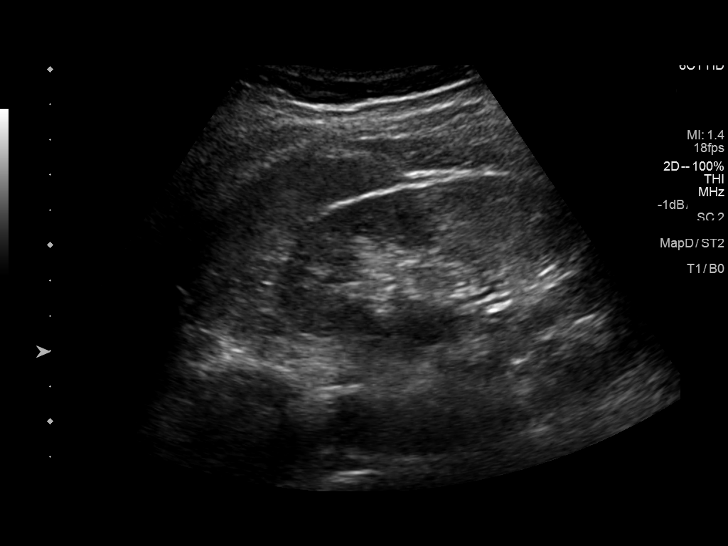
[im 19/28]
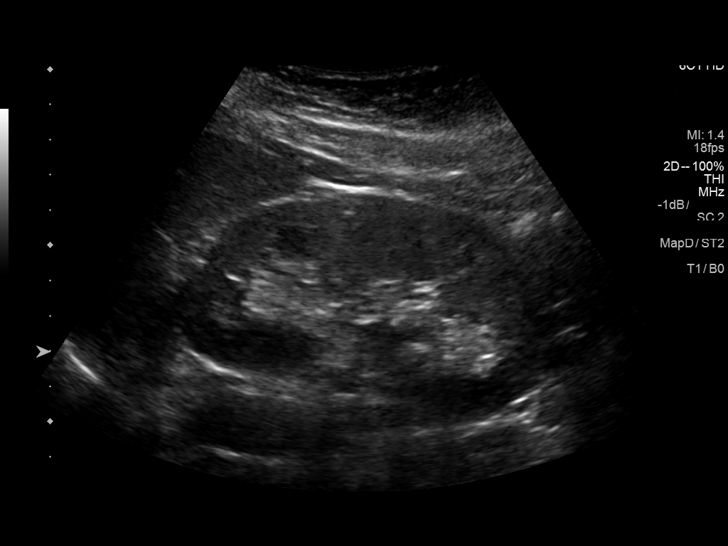
[im 21/28]
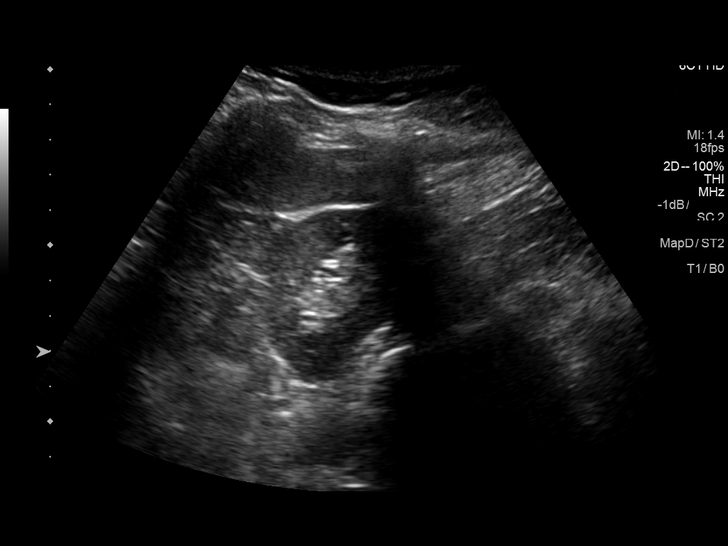
[im 23/28]
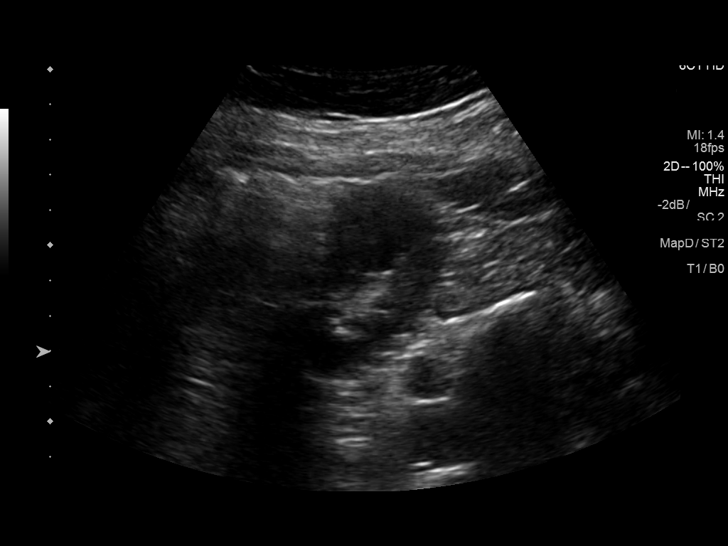
[im 25/28]
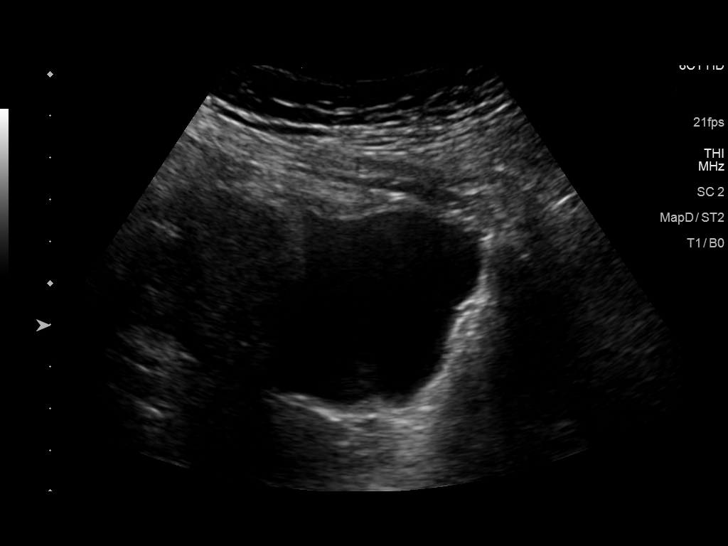
[im 28/28]
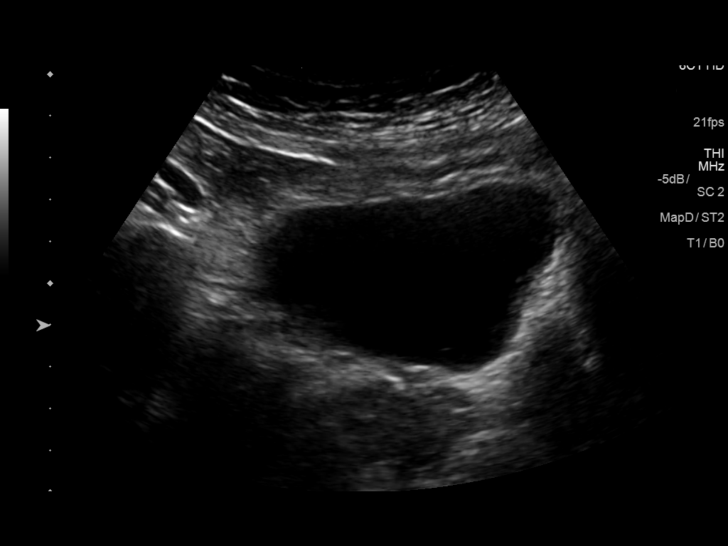

[14 of 25 positions shown; findings below may reference images not displayed]

FINDINGS: Right Kidney:

Renal measurements: 9.8 cm length by 4.1 cm height by 4.7 cm wide =
volume: 98.4 mL . Echogenicity within normal limits. No mass or
hydronephrosis visualized.

Left Kidney:

Renal measurements: 11.2 cm length by 5.5 cm height by 4.7 cm wide =
volume: 151 mL. Echogenicity within normal limits. No mass or
hydronephrosis visualized.

Bladder:

Appears normal for degree of bladder distention.
IMPRESSION: Normal renal ultrasound

## 2020-07-13 ENCOUNTER — Encounter: Payer: Self-pay | Admitting: Family Medicine

## 2020-07-13 ENCOUNTER — Other Ambulatory Visit: Payer: Self-pay

## 2020-07-13 ENCOUNTER — Ambulatory Visit: Payer: 59 | Admitting: Family Medicine

## 2020-07-13 VITALS — BP 99/63 | HR 62 | Temp 98.2°F | Wt 140.0 lb

## 2020-07-13 DIAGNOSIS — Z130 Encounter for screening for diseases of the blood and blood-forming organs and certain disorders involving the immune mechanism: Secondary | ICD-10-CM

## 2020-07-13 DIAGNOSIS — Z1329 Encounter for screening for other suspected endocrine disorder: Secondary | ICD-10-CM

## 2020-07-13 DIAGNOSIS — Z1159 Encounter for screening for other viral diseases: Secondary | ICD-10-CM | POA: Diagnosis not present

## 2020-07-13 DIAGNOSIS — Z13220 Encounter for screening for lipoid disorders: Secondary | ICD-10-CM

## 2020-07-13 DIAGNOSIS — J309 Allergic rhinitis, unspecified: Secondary | ICD-10-CM | POA: Diagnosis not present

## 2020-07-13 DIAGNOSIS — G4709 Other insomnia: Secondary | ICD-10-CM

## 2020-07-13 DIAGNOSIS — Z13228 Encounter for screening for other metabolic disorders: Secondary | ICD-10-CM

## 2020-07-13 MED ORDER — HYDROXYZINE HCL 25 MG PO TABS: 12.5000 mg | ORAL_TABLET | Freq: Every day | ORAL | 2 refills | Status: DC

## 2020-07-13 MED ORDER — CETIRIZINE HCL 10 MG PO TABS: 10.0000 mg | ORAL_TABLET | Freq: Every day | ORAL | 11 refills | Status: DC

## 2020-07-13 NOTE — Patient Instructions (Addendum)
Quality Sleep Information, Adult Quality sleep is important for your mental and physical health. It also improves your quality of life. Quality sleep means you:  Are asleep for most of the time you are in bed.  Fall asleep within 30 minutes.  Wake up no more than once a night.  Are awake for no longer than 20 minutes if you do wake up during the night. Most adults need 7-8 hours of quality sleep each night. How can poor sleep affect me? If you do not get enough quality sleep, you may have:  Mood swings.  Daytime sleepiness.  Confusion.  Decreased reaction time.  Sleep disorders, such as insomnia and sleep apnea.  Difficulty with: ? Solving problems. ? Coping with stress. ? Paying attention. These issues may affect your performance and productivity at work, school, and at home. Lack of sleep may also put you at higher risk for accidents, suicide, and risky behaviors. If you do not get quality sleep you may also be at higher risk for several health problems, including:  Infections.  Type 2 diabetes.  Heart disease.  High blood pressure.  Obesity.  Worsening of long-term conditions, like arthritis, kidney disease, depression, Parkinson's disease, and epilepsy. What actions can I take to get more quality sleep?  Stick to a sleep schedule. Go to sleep and wake up at about the same time each day. Do not try to sleep less on weekdays and make up for lost sleep on weekends. This does not work.  Try to get about 30 minutes of exercise on most days. Do not exercise 2-3 hours before going to bed.  Limit naps during the day to 30 minutes or less.  Do not use any products that contain nicotine or tobacco, such as cigarettes or e-cigarettes. If you need help quitting, ask your health care provider.  Do not drink caffeinated beverages for at least 8 hours before going to bed. Coffee, tea, and some sodas contain caffeine.  Do not drink alcohol close to bedtime.  Do not  eat large meals close to bedtime.  Do not take naps in the late afternoon.  Try to get at least 30 minutes of sunlight every day. Morning sunlight is best.  Make time to relax before bed. Reading, listening to music, or taking a hot bath promotes quality sleep.  Make your bedroom a place that promotes quality sleep. Keep your bedroom dark, quiet, and at a comfortable room temperature. Make sure your bed is comfortable. Take out sleep distractions like TV, a computer, smartphone, and bright lights.  If you are lying awake in bed for longer than 20 minutes, get up and do a relaxing activity until you feel sleepy.  Work with your health care provider to treat medical conditions that may affect sleeping, such as: ? Nasal obstruction. ? Snoring. ? Sleep apnea and other sleep disorders.  Talk to your health care provider if you think any of your prescription medicines may cause you to have difficulty falling or staying asleep.  If you have sleep problems, talk with a sleep consultant. If you think you have a sleep disorder, talk with your health care provider about getting evaluated by a specialist.      Where to find more information  National Sleep Foundation website: https://sleepfoundation.org  National Heart, Lung, and Blood Institute (NHLBI): https://hall.info/.pdf  Centers for Disease Control and Prevention (CDC): DetailSports.is Contact a health care provider if you:  Have trouble getting to sleep or staying asleep.  Often  wake up very early in the morning and cannot get back to sleep.  Have daytime sleepiness.  Have daytime sleep attacks of suddenly falling asleep and sudden muscle weakness (narcolepsy).  Have a tingling sensation in your legs with a strong urge to move your legs (restless legs syndrome).  Stop breathing briefly during sleep (sleep apnea).  Think you have a sleep disorder or are taking a medicine that is  affecting your quality of sleep. Summary  Most adults need 7-8 hours of quality sleep each night.  Getting enough quality sleep is an important part of health and well-being.  Make your bedroom a place that promotes quality sleep and avoid things that may cause you to have poor sleep, such as alcohol, caffeine, smoking, and large meals.  Talk to your health care provider if you have trouble falling asleep or staying asleep. This information is not intended to replace advice given to you by your health care provider. Make sure you discuss any questions you have with your health care provider. Document Revised: 08/28/2017 Document Reviewed: 08/28/2017 Elsevier Patient Education  2021 [employer].    If you have lab work done today you will be contacted with your lab results within the next 2 weeks.  If you have not heard from [place] then please contact us. The fastest way to get your results is to register for My Chart.   IF you received an x-ray today, you will receive an invoice from [facility]. Please contact [facility] at [phone] with questions or concerns regarding your invoice.   IF you received labwork today, you will receive an invoice from [place]. Please contact [facility] at [phone] with questions or concerns regarding your invoice.   Our billing staff will not be able to assist you with questions regarding bills from these companies.  You will be contacted with the lab results as soon as they are available. The fastest way to get your results is to activate your My Chart account. Instructions are located on the last page of this paperwork. If you have not heard from [place] regarding the results in 2 weeks, please contact this office.

## 2020-07-13 NOTE — Progress Notes (Signed)
2/9/202212:42 PM  [name] 1970-07-19, 50 y.o., female [id number]  Chief Complaint  Patient presents with  . Insomnia    Since Covid 06/14/20 - not able to go into deep stage of sleep,wakes up frequently     HPI:   Patient is a 50 y.o. female with past medical history significant for allergies and vitamin d deficiency who presents today for Insomnia  Since covid has had insomnia Had covid 3 weeks ago Before covid 10pm wake up 6am Would wake up feeling refreshed Never needed medications for sleep Wakes up during the night Easy to fall asleep, but wakes up during the night and cant get back to sleep Has not tried anything to help with sleep Yesterday bought melatonin   Depression screen [facility] 2/9 07/13/2020 10/05/2019 06/19/2019  Decreased Interest 0 0 0  Down, Depressed, Hopeless 0 0 0  PHQ - 2 Score 0 0 0    Fall Risk  07/13/2020 10/05/2019 06/19/2019 05/02/2018 04/15/2018  Falls in the past year? 0 0 0 0 1  Number falls in past yr: 0 0 0 - -  Injury with Fall? 0 0 0 - -  Follow up Falls evaluation completed Falls evaluation completed - - -     No Known Allergies  Prior to Admission medications   Medication Sig Start Date End Date Taking? Authorizing Provider  cetirizine (ZYRTEC) 10 MG tablet Take 1 tablet (10 mg total) by mouth daily. Patient not taking: Reported on 07/13/2020 04/15/18   Benjiman Core D, PA-C  fluticasone [facility]) 50 MCG/ACT nasal spray Place 2 sprays into both nostrils daily. Patient not taking: Reported on 07/13/2020 04/15/18   Magdalene River, PA-C    History reviewed. No pertinent past medical history.  Past Surgical History:  Procedure Laterality Date  . DILATION AND EVACUATION N/A 05/08/2013   Procedure: DILATATION AND EVACUATION;  Surgeon: Antionette Char, MD;  Location: [facility];  Service: Gynecology;  Laterality: N/A;    Social History   Tobacco Use  . Smoking status: Never Smoker  . Smokeless tobacco: Never Used  Substance Use  Topics  . Alcohol use: No    Family History  Problem Relation Age of Onset  . Hypertension Mother   . Asthma Father   . Diabetes Maternal Grandmother   . Hypertension Maternal Grandmother     Review of Systems  Constitutional: Negative for chills, fever and malaise/fatigue.  Eyes: Negative for blurred vision and double vision.  Respiratory: Negative for cough, shortness of breath and wheezing.   Cardiovascular: Negative for chest pain, palpitations and leg swelling.  Gastrointestinal: Negative for abdominal pain, blood in stool, constipation, diarrhea, heartburn, nausea and vomiting.  Genitourinary: Negative for dysuria, frequency and hematuria.  Musculoskeletal: Negative for back pain and joint pain.  Skin: Negative for rash.  Neurological: Negative for dizziness, weakness and headaches.  Psychiatric/Behavioral: Negative for depression and substance abuse. The patient has insomnia. The patient is not nervous/anxious.      OBJECTIVE:  Today's Vitals   07/13/20 1239  BP: 99/63  Pulse: 62  Temp: 98.2 F (36.8 C)  SpO2: 100%  Weight: 140 lb (63.5 kg)   Body mass index is 28.76 kg/m.   Physical Exam Constitutional:      General: She is not in acute distress.    Appearance: Normal appearance. She is not ill-appearing.  HENT:     Head: Normocephalic.  Cardiovascular:     Rate and Rhythm: Normal rate and regular rhythm.  Pulses: Normal pulses.     Heart sounds: Normal heart sounds. No murmur heard. No friction rub. No gallop.   Pulmonary:     Effort: Pulmonary effort is normal. No respiratory distress.     Breath sounds: Normal breath sounds. No stridor. No wheezing, rhonchi or rales.  Abdominal:     General: Bowel sounds are normal.     Palpations: Abdomen is soft.     Tenderness: There is no abdominal tenderness.  Musculoskeletal:     Right lower leg: No edema.     Left lower leg: No edema.  Skin:    General: Skin is warm and dry.  Neurological:      Mental Status: She is alert and oriented to person, place, and time.  Psychiatric:        Mood and Affect: Mood normal.        Behavior: Behavior normal.     No results found for this or any previous visit (from the past 24 hour(s)).  No results found.   ASSESSMENT and PLAN  Problem List Items Addressed This Visit   None   Visit Diagnoses    Other insomnia    -  Primary   Relevant Medications   hydrOXYzine (ATARAX/VISTARIL) 25 MG tablet   Allergic rhinitis, unspecified seasonality, unspecified trigger       Relevant Medications   cetirizine (ZYRTEC) 10 MG tablet   Screening for endocrine, metabolic and immunity disorder       Relevant Orders   CBC with Differential   TSH   Vitamin D, 25-hydroxy   Hemoglobin A1c   Encounter for hepatitis C screening test for low risk patient       Relevant Orders   Hepatitis C antibody   Screening for lipid disorders       Relevant Orders   Lipid Panel      Plan . Will follow up with lab results . Take hydroxyzine as needed for sleep . Continue Melatonin as needed . RTC precautions provided   Return in about 6 months (around 01/10/2021).    [name] Cassandra Lafountain, FNP-BC Primary Care at [facility] [address] [place], [place] [address] Ph.  [phone] Fax [phone]

## 2020-07-14 ENCOUNTER — Other Ambulatory Visit: Payer: Self-pay | Admitting: Family Medicine

## 2020-07-14 DIAGNOSIS — E559 Vitamin D deficiency, unspecified: Secondary | ICD-10-CM

## 2020-07-14 LAB — CBC WITH DIFFERENTIAL/PLATELET
Basophils Absolute: 0 10*3/uL (ref 0.0–0.2)
Basos: 0 %
EOS (ABSOLUTE): 0.2 10*3/uL (ref 0.0–0.4)
Eos: 3 %
Hematocrit: 40.3 % (ref 34.0–46.6)
Hemoglobin: 13.3 g/dL (ref 11.1–15.9)
Immature Grans (Abs): 0 10*3/uL (ref 0.0–0.1)
Immature Granulocytes: 0 %
Lymphocytes Absolute: 1.7 10*3/uL (ref 0.7–3.1)
Lymphs: 31 %
MCH: 27.9 pg (ref 26.6–33.0)
MCHC: 33 g/dL (ref 31.5–35.7)
MCV: 85 fL (ref 79–97)
Monocytes Absolute: 0.4 10*3/uL (ref 0.1–0.9)
Monocytes: 8 %
Neutrophils Absolute: 3.2 10*3/uL (ref 1.4–7.0)
Neutrophils: 58 %
Platelets: 270 10*3/uL (ref 150–450)
RBC: 4.76 x10E6/uL (ref 3.77–5.28)
RDW: 13.3 % (ref 11.7–15.4)
WBC: 5.5 10*3/uL (ref 3.4–10.8)

## 2020-07-14 LAB — HEMOGLOBIN A1C
Est. average glucose Bld gHb Est-mCnc: 108 mg/dL
Hgb A1c MFr Bld: 5.4 % (ref 4.8–5.6)

## 2020-07-14 LAB — HEPATITIS C ANTIBODY: Hep C Virus Ab: <0.1 s/co ratio (ref 0.0–0.9)

## 2020-07-14 LAB — LIPID PANEL
Chol/HDL Ratio: 3.2 ratio (ref 0.0–4.4)
Cholesterol, Total: 142 mg/dL (ref 100–199)
HDL: 44 mg/dL (ref >39)
LDL Chol Calc (NIH): 78 mg/dL (ref 0–99)
Triglycerides: 110 mg/dL (ref 0–149)
VLDL Cholesterol Cal: 20 mg/dL (ref 5–40)

## 2020-07-14 LAB — VITAMIN D 25 HYDROXY (VIT D DEFICIENCY, FRACTURES): Vit D, 25-Hydroxy: 9.1 ng/mL — ABNORMAL LOW (ref 30.0–100.0)

## 2020-07-14 LAB — TSH: TSH: 1.34 u[IU]/mL (ref 0.450–4.500)

## 2020-07-14 MED ORDER — VITAMIN D (ERGOCALCIFEROL) 1.25 MG (50000 UNIT) PO CAPS: 50000.0000 [IU] | ORAL_CAPSULE | ORAL | 5 refills | Status: DC

## 2020-07-14 NOTE — Progress Notes (Signed)
Overall labs look great. The only addition I recommend is a once a week vitamin D replacement which I sent to her pharmacy given her extremely low levels.

## 2020-08-11 ENCOUNTER — Other Ambulatory Visit (HOSPITAL_COMMUNITY)
Admission: RE | Admit: 2020-08-11 | Discharge: 2020-08-11 | Disposition: A | Discharge status: Home / Self Care | Payer: 59 | Source: Ambulatory Visit | Attending: Family Medicine | Admitting: Family Medicine

## 2020-08-11 ENCOUNTER — Other Ambulatory Visit: Payer: Self-pay

## 2020-08-11 ENCOUNTER — Encounter: Payer: Self-pay | Admitting: Family Medicine

## 2020-08-11 ENCOUNTER — Ambulatory Visit (INDEPENDENT_AMBULATORY_CARE_PROVIDER_SITE_OTHER): Payer: 59 | Admitting: Family Medicine

## 2020-08-11 DIAGNOSIS — Z87448 Personal history of other diseases of urinary system: Secondary | ICD-10-CM

## 2020-08-11 DIAGNOSIS — N920 Excessive and frequent menstruation with regular cycle: Secondary | ICD-10-CM | POA: Diagnosis not present

## 2020-08-11 LAB — POCT URINALYSIS DIP (MANUAL ENTRY)
Bilirubin, UA: NEGATIVE
Glucose, UA: NEGATIVE mg/dL
Ketones, POC UA: NEGATIVE mg/dL
Leukocytes, UA: NEGATIVE
Nitrite, UA: NEGATIVE
Protein Ur, POC: NEGATIVE mg/dL
Spec Grav, UA: >=1.03 — AB (ref 1.010–1.025)
Urobilinogen, UA: 0.2 E.U./dL
pH, UA: 5 (ref 5.0–8.0)

## 2020-08-11 LAB — POC MICROSCOPIC URINALYSIS (UMFC): Mucus: ABSENT

## 2020-08-11 LAB — POCT WET + KOH PREP
Trich by wet prep: ABSENT
Yeast by KOH: ABSENT
Yeast by wet prep: ABSENT

## 2020-08-11 LAB — POCT URINE PREGNANCY: Preg Test, Ur: NEGATIVE

## 2020-08-11 NOTE — Progress Notes (Signed)
3/10/202212:00 PM  [name] [birth year], 50 y.o., female [id number]  Chief Complaint  Patient presents with  . Menstrual Problem    Patient has been menstruating since Jan 2022 - light to dark in color    HPI:   Patient is a 50 y.o. female with past medical history significant for allergies and vitamin d deficiency who presents today for irregular menstrual period.  Period will not go away Only sees blood when wipes Has not had a regular period since December Now Linkin Vizzini having spotting, not enough to show up on a pad Last pregnancy 2014 Doesn't believe she is pregnant Last pap 04/15/18 nilm Does not see a gynecologist Has been seen in the past 2019 for asymptomatic hematuria Had pelvic and renal US done 2019- normal A referral was placed to urology at that time but never went  Had regular periods prior to this   Vitamin D deficiency 50k weekly Last vitamin D Lab Results  Component Value Date   VD25OH 9.1 (L) 07/13/2020     Depression screen [facility] 2/9 08/11/2020 07/13/2020 10/05/2019  Decreased Interest 0 0 0  Down, Depressed, Hopeless 0 0 0  PHQ - 2 Score 0 0 0    Fall Risk  08/11/2020 07/13/2020 10/05/2019 06/19/2019 05/02/2018  Falls in the past year? 0 0 0 0 0  Number falls in past yr: 0 0 0 0 -  Injury with Fall? 0 0 0 0 -  Follow up Falls evaluation completed Falls evaluation completed Falls evaluation completed - -     No Known Allergies  Prior to Admission medications   Medication Sig Start Date End Date Taking? Authorizing Provider  cetirizine (ZYRTEC) 10 MG tablet Take 1 tablet (10 mg total) by mouth daily. Patient not taking: Reported on 07/13/2020 04/15/18   Tenna Delaine D, PA-C  fluticasone [facility]) 50 MCG/ACT nasal spray Place 2 sprays into both nostrils daily. Patient not taking: Reported on 07/13/2020 04/15/18   Leonie Douglas, PA-C    History reviewed. No pertinent past medical history.  Past Surgical History:  Procedure Laterality Date   . DILATION AND EVACUATION N/A 05/08/2013   Procedure: DILATATION AND EVACUATION;  Surgeon: Lahoma Crocker, MD;  Location: [facility];  Service: Gynecology;  Laterality: N/A;    Social History   Tobacco Use  . Smoking status: Never Smoker  . Smokeless tobacco: Never Used  Substance Use Topics  . Alcohol use: No    Family History  Problem Relation Age of Onset  . Hypertension Mother   . Asthma Father   . Diabetes Maternal Grandmother   . Hypertension Maternal Grandmother     Review of Systems  Constitutional: Negative for chills, fever and malaise/fatigue.  Respiratory: Negative for cough, shortness of breath and wheezing.   Cardiovascular: Negative for chest pain, palpitations and leg swelling.  Gastrointestinal: Negative for abdominal pain, blood in stool, constipation, diarrhea, heartburn, nausea and vomiting.  Genitourinary: Negative for dysuria, flank pain, frequency, hematuria and urgency.  Musculoskeletal: Negative for back pain and joint pain.  Skin: Negative for itching and rash.  Neurological: Negative for dizziness, weakness and headaches.  Psychiatric/Behavioral: The patient does not have insomnia.      OBJECTIVE:  Today's Vitals   08/11/20 1051  BP: 119/74  Pulse: (!) 58  Temp: 98 F (36.7 C)  SpO2: 100%  Weight: 139 lb (63 kg)  Height: $Remove'4\' 10"'TVspSoQ$  (1.473 m)   Body mass index is 29.05 kg/m.   Physical Exam Constitutional:  General: She is not in acute distress.    Appearance: Normal appearance. She is not ill-appearing.  HENT:     Head: Normocephalic.  Cardiovascular:     Rate and Rhythm: Normal rate and regular rhythm.     Pulses: Normal pulses.     Heart sounds: Normal heart sounds. No murmur heard. No friction rub. No gallop.   Pulmonary:     Effort: Pulmonary effort is normal. No respiratory distress.     Breath sounds: Normal breath sounds. No stridor. No wheezing, rhonchi or rales.  Abdominal:     General: Bowel sounds are normal.  There is no distension.     Palpations: Abdomen is soft. There is no mass.     Tenderness: There is no abdominal tenderness. There is no right CVA tenderness, left CVA tenderness, guarding or rebound.  Musculoskeletal:     Right lower leg: No edema.     Left lower leg: No edema.  Skin:    General: Skin is warm and dry.  Neurological:     Mental Status: She is alert and oriented to person, place, and time.  Psychiatric:        Mood and Affect: Mood normal.        Behavior: Behavior normal.     Results for orders placed or performed in visit on 08/11/20 (from the past 24 hour(s))  POCT urinalysis dipstick     Status: Abnormal   Collection Time: 08/11/20 11:31 AM  Result Value Ref Range   Color, UA yellow yellow   Clarity, UA clear clear   Glucose, UA negative negative mg/dL   Bilirubin, UA negative negative   Ketones, POC UA negative negative mg/dL   Spec Grav, UA >=1.030 (A) 1.010 - 1.025   Blood, UA large (A) negative   pH, UA 5.0 5.0 - 8.0   Protein Ur, POC negative negative mg/dL   Urobilinogen, UA 0.2 0.2 or 1.0 E.U./dL   Nitrite, UA Negative Negative   Leukocytes, UA Negative Negative  POCT Microscopic Urinalysis ([facility])     Status: Abnormal   Collection Time: 08/11/20 11:32 AM  Result Value Ref Range   WBC,UR,HPF,POC None None WBC/hpf   RBC,UR,HPF,POC Few (A) None RBC/hpf   Bacteria None None, Too numerous to count   Mucus Absent Absent   Epithelial Cells, UR Per Microscopy Few (A) None, Too numerous to count cells/hpf  POCT urine pregnancy     Status: None   Collection Time: 08/11/20 11:33 AM  Result Value Ref Range   Preg Test, Ur Negative Negative  POCT Wet + KOH Prep     Status: Abnormal   Collection Time: 08/11/20 11:34 AM  Result Value Ref Range   Yeast by KOH Absent Absent   Yeast by wet prep Absent Absent   WBC by wet prep Few Few   Clue Cells Wet Prep HPF POC None None   Trich by wet prep Absent Absent   Bacteria Wet Prep HPF POC Few Few   Epithelial  Cells By [employer] Pref ([facility]) Few None, Few, Too numerous to count   RBC,UR,HPF,POC Few (A) None RBC/hpf    No results found.   ASSESSMENT and PLAN  Problem List Items Addressed This Visit   None   Visit Diagnoses    Spotting       Relevant Orders   POCT urinalysis dipstick (Completed)   Urine cytology ancillary only   POCT Wet + KOH Prep (Completed)   POCT urine pregnancy (Completed)  POCT Microscopic Urinalysis ([facility]) (Completed)   RPR   Ambulatory referral to Gynecology   History of hematuria       Relevant Orders   CMP14+EGFR   CBC with Differential      Plan . Will follow up with lab results . RTC precautions provided   Return in about 3 months (around 11/11/2020).    Huston Foley Aureliano Oshields, FNP-BC Primary Care at [place],  [address] Ph.  [phone] Fax [phone]

## 2020-08-11 NOTE — Patient Instructions (Signed)
Health Maintenance After Age 48 After age 48, you are at a higher risk for certain long-term diseases and infections as well as injuries from falls. Falls are a major cause of broken bones and head injuries in people who are older than age 48. Getting regular preventive care can help to keep you healthy and well. Preventive care includes getting regular testing and making lifestyle changes as recommended by your health care provider. Talk with your health care provider about:  Which screenings and tests you should have. A screening is a test that checks for a disease when you have no symptoms.  A diet and exercise plan that is right for you. What should I know about screenings and tests to prevent falls? Screening and testing are the best ways to find a health problem early. Early diagnosis and treatment give you the best chance of managing medical conditions that are common after age 48. Certain conditions and lifestyle choices may make you more likely to have a fall. Your health care provider may recommend:  Regular vision checks. Poor vision and conditions such as cataracts can make you more likely to have a fall. If you wear glasses, make sure to get your prescription updated if your vision changes.  Medicine review. Work with your health care provider to regularly review all of the medicines you are taking, including over-the-counter medicines. Ask your health care provider about any side effects that may make you more likely to have a fall. Tell your health care provider if any medicines that you take make you feel dizzy or sleepy.  Osteoporosis screening. Osteoporosis is a condition that causes the bones to get weaker. This can make the bones weak and cause them to break more easily.  Blood pressure screening. Blood pressure changes and medicines to control blood pressure can make you feel dizzy.  Strength and balance checks. Your health care provider may recommend certain tests to check your  strength and balance while standing, walking, or changing positions.  Foot health exam. Foot pain and numbness, as well as not wearing proper footwear, can make you more likely to have a fall.  Depression screening. You may be more likely to have a fall if you have a fear of falling, feel emotionally low, or feel unable to do activities that you used to do.  Alcohol use screening. Using too much alcohol can affect your balance and may make you more likely to have a fall. What actions can I take to lower my risk of falls? General instructions  Talk with your health care provider about your risks for falling. Tell your health care provider if: ? You fall. Be sure to tell your health care provider about all falls, even ones that seem minor. ? You feel dizzy, sleepy, or off-balance.  Take over-the-counter and prescription medicines only as told by your health care provider. These include any supplements.  Eat a healthy diet and maintain a healthy weight. A healthy diet includes low-fat dairy products, low-fat (lean) meats, and fiber from whole grains, beans, and lots of fruits and vegetables. Home safety  Remove any tripping hazards, such as rugs, cords, and clutter.  Install safety equipment such as grab bars in bathrooms and safety rails on stairs.  Keep rooms and walkways well-lit. Activity  Follow a regular exercise program to stay fit. This will help you maintain your balance. Ask your health care provider what types of exercise are appropriate for you.  If you need a cane or walker,   use it as recommended by your health care provider.  Wear supportive shoes that have nonskid soles.   Lifestyle  Do not drink alcohol if your health care provider tells you not to drink.  If you drink alcohol, limit how much you have: ? 0-1 drink a day for women. ? 0-2 drinks a day for men.  Be aware of how much alcohol is in your drink. In the [place] one drink equals one typical bottle of beer (12  oz), one-half glass of wine (5 oz), or one shot of hard liquor (1 oz).  Do not use any products that contain nicotine or tobacco, such as cigarettes and e-cigarettes. If you need help quitting, ask your health care provider. Summary  Having a healthy lifestyle and getting preventive care can help to protect your health and wellness after age 48.  Screening and testing are the best way to find a health problem early and help you avoid having a fall. Early diagnosis and treatment give you the best chance for managing medical conditions that are more common for people who are older than age 48.  Falls are a major cause of broken bones and head injuries in people who are older than age 48. Take precautions to prevent a fall at home.  Work with your health care provider to learn what changes you can make to improve your health and wellness and to prevent falls. This information is not intended to replace advice given to you by your health care provider. Make sure you discuss any questions you have with your health care provider. Document Revised: 09/11/2018 Document Reviewed: 04/03/2017 Elsevier Patient Education  2021 [employer]  

## 2020-08-12 LAB — CMP14+EGFR
ALT: 8 [IU]/L (ref 0–32)
AST: 11 [IU]/L (ref 0–40)
Albumin/Globulin Ratio: 1.6 (ref 1.2–2.2)
Albumin: 4.4 g/dL (ref 3.8–4.8)
Alkaline Phosphatase: 44 [IU]/L (ref 44–121)
BUN/Creatinine Ratio: 14 (ref 9–23)
BUN: 8 mg/dL (ref 6–24)
Bilirubin Total: 0.3 mg/dL (ref 0.0–1.2)
CO2: 18 mmol/L — ABNORMAL LOW (ref 20–29)
Calcium: 9.2 mg/dL (ref 8.7–10.2)
Chloride: 105 mmol/L (ref 96–106)
Creatinine, Ser: 0.57 mg/dL (ref 0.57–1.00)
Globulin, Total: 2.8 g/dL (ref 1.5–4.5)
Glucose: 76 mg/dL (ref 65–99)
Potassium: 4.3 mmol/L (ref 3.5–5.2)
Sodium: 139 mmol/L (ref 134–144)
Total Protein: 7.2 g/dL (ref 6.0–8.5)
eGFR: 112 mL/min/{1.73_m2}

## 2020-08-12 LAB — CBC WITH DIFFERENTIAL/PLATELET
Basophils Absolute: 0 10*3/uL (ref 0.0–0.2)
Basos: 0 %
EOS (ABSOLUTE): 0.1 10*3/uL (ref 0.0–0.4)
Eos: 2 %
Hematocrit: 41.7 % (ref 34.0–46.6)
Hemoglobin: 14.2 g/dL (ref 11.1–15.9)
Immature Grans (Abs): 0 10*3/uL (ref 0.0–0.1)
Immature Granulocytes: 0 %
Lymphocytes Absolute: 1.5 10*3/uL (ref 0.7–3.1)
Lymphs: 33 %
MCH: 29 pg (ref 26.6–33.0)
MCHC: 34.1 g/dL (ref 31.5–35.7)
MCV: 85 fL (ref 79–97)
Monocytes Absolute: 0.3 10*3/uL (ref 0.1–0.9)
Monocytes: 7 %
Neutrophils Absolute: 2.6 10*3/uL (ref 1.4–7.0)
Neutrophils: 58 %
Platelets: 242 10*3/uL (ref 150–450)
RBC: 4.89 x10E6/uL (ref 3.77–5.28)
RDW: 13.9 % (ref 11.7–15.4)
WBC: 4.5 10*3/uL (ref 3.4–10.8)

## 2020-08-12 LAB — URINE CYTOLOGY ANCILLARY ONLY
Bacterial Vaginitis-Urine: NEGATIVE
Candida Urine: NEGATIVE
Chlamydia: NEGATIVE
Comment: NEGATIVE
Comment: NEGATIVE
Comment: NORMAL
Neisseria Gonorrhea: NEGATIVE
Trichomonas: NEGATIVE

## 2020-08-12 LAB — RPR: RPR Ser Ql: NONREACTIVE

## 2020-08-13 NOTE — Progress Notes (Signed)
Overall labs look good. Referral was placed to gynecology they will be contacting you.

## 2020-08-25 ENCOUNTER — Ambulatory Visit: Payer: 59 | Admitting: Nurse Practitioner

## 2020-08-25 ENCOUNTER — Other Ambulatory Visit: Payer: Self-pay

## 2020-08-25 ENCOUNTER — Encounter: Payer: Self-pay | Admitting: Nurse Practitioner

## 2020-08-25 VITALS — BP 108/64 | HR 64 | Resp 16 | Ht 58.75 in | Wt 138.0 lb

## 2020-08-25 DIAGNOSIS — N921 Excessive and frequent menstruation with irregular cycle: Secondary | ICD-10-CM

## 2020-08-25 DIAGNOSIS — Z01419 Encounter for gynecological examination (general) (routine) without abnormal findings: Secondary | ICD-10-CM | POA: Diagnosis not present

## 2020-08-25 DIAGNOSIS — F419 Anxiety disorder, unspecified: Secondary | ICD-10-CM | POA: Diagnosis not present

## 2020-08-25 DIAGNOSIS — Z30011 Encounter for initial prescription of contraceptive pills: Secondary | ICD-10-CM

## 2020-08-25 LAB — PREGNANCY, URINE: Preg Test, Ur: NEGATIVE

## 2020-08-25 MED ORDER — LEVONORGESTREL-ETHINYL ESTRAD 0.1-20 MG-MCG PO TABS: 1.0000 | ORAL_TABLET | Freq: Every day | ORAL | 1 refills | Status: DC

## 2020-08-25 MED ORDER — BUSPIRONE HCL 5 MG PO TABS: 5.0000 mg | ORAL_TABLET | Freq: Two times a day (BID) | ORAL | 1 refills | Status: DC

## 2020-08-25 NOTE — Patient Instructions (Signed)
Some books to consider in Management of Anxiety:  Feeling Good by Allyson Sabal (also, his next book Feeling Great) you can also download Feeling Good Podcasts  Good Morning, I Love You by Gardiner Sleeper Think Like a Monk by Berniece Pap (his Podcast "On Purpose")  Other techniques that can sometimes be helpful:  Keeping a journal Exercise a minimum of 150 min per week Eating a diet low in sugar and high in vegetables Fostering relationships with friends (friend with positive energy), minimize time with friends that zap your energy Meditation, relaxation, reflection, Yoga, etc Re-framing negative thoughts, searching for silver linings  3-3-3 Rule: 1. SIGHT If you feel anxiety coming on, take a pause. Look all around you. Focus on your vision and the physical objects that surround you. Then, name three things you can see within your environment. Pay attention to the details of each. If you see a flower pot, notice the color or a pattern of cracks in it's glaze coating.  2. SOUND After that you have taken note of three things within your sight, start listening. Identify three sounds that you can hear. Take note of the unique pitch or cadence of each.  3. TOUCH Once you've embraced the new melodies that surround you, engage your sense of touch. Choose three parts of your body to move. For instance, tap your fingers, wiggle your toes, and move your head from side to side.  333, in this context, is a tool for emotional grounding. Some consider it a trick to distract your brain. Actually, there's more to it than that. Anxiety and overwhelm are often results of feeling like things are out of your control. Unfortunately, it's not that easy to let go of things you can't control.  On the other hand, there are still things that are within your control. By engaging your senses and the 333 exercise, you can shift your awareness to the present. When you bring yourself back to center, you can overcome  negative feelings. While we often cannot change what's happening in the world around [place], we can reshape the way we look at our presence within the world.   Health Maintenance, Female Adopting a healthy lifestyle and getting preventive care are important in promoting health and wellness. Ask your health care provider about:  The right schedule for you to have regular tests and exams.  Things you can do on your own to prevent diseases and keep yourself healthy. What should I know about diet, weight, and exercise? Eat a healthy diet  Eat a diet that includes plenty of vegetables, fruits, low-fat dairy products, and lean protein.  Do not eat a lot of foods that are high in solid fats, added sugars, or sodium.   Maintain a healthy weight Body mass index (BMI) is used to identify weight problems. It estimates body fat based on height and weight. Your health care provider can help determine your BMI and help you achieve or maintain a healthy weight. Get regular exercise Get regular exercise. This is one of the most important things you can do for your health. Most adults should:  Exercise for at least 150 minutes each week. The exercise should increase your heart rate and make you sweat (moderate-intensity exercise).  Do strengthening exercises at least twice a week. This is in addition to the moderate-intensity exercise.  Spend less time sitting. Even light physical activity can be beneficial. Watch cholesterol and blood lipids Have your blood tested for lipids and cholesterol at 48 years  of age, then have this test every 5 years. Have your cholesterol levels checked more often if:  Your lipid or cholesterol levels are high.  You are older than 48 years of age.  You are at high risk for heart disease. What should I know about cancer screening? Depending on your health history and family history, you may need to have cancer screening at various ages. This may include screening  for:  Breast cancer.  Cervical cancer.  Colorectal cancer.  Skin cancer.  Lung cancer. What should I know about heart disease, diabetes, and high blood pressure? Blood pressure and heart disease  High blood pressure causes heart disease and increases the risk of stroke. This is more likely to develop in people who have high blood pressure readings, are of African descent, or are overweight.  Have your blood pressure checked: ? Every 3-5 years if you are 28-75 years of age. ? Every year if you are 67 years old or older. Diabetes Have regular diabetes screenings. This checks your fasting blood sugar level. Have the screening done:  Once every three years after age 38 if you are at a normal weight and have a low risk for diabetes.  More often and at a younger age if you are overweight or have a high risk for diabetes. What should I know about preventing infection? Hepatitis B If you have a higher risk for hepatitis B, you should be screened for this virus. Talk with your health care provider to find out if you are at risk for hepatitis B infection. Hepatitis C Testing is recommended for:  Everyone born from [birth year] through 1963.  Anyone with known risk factors for hepatitis C. Sexually transmitted infections (STIs)  Get screened for STIs, including gonorrhea and chlamydia, if: ? You are sexually active and are younger than 48 years of age. ? You are older than 48 years of age and your health care provider tells you that you are at risk for this type of infection. ? Your sexual activity has changed since you were last screened, and you are at increased risk for chlamydia or gonorrhea. Ask your health care provider if you are at risk.  Ask your health care provider about whether you are at high risk for HIV. Your health care provider may recommend a prescription medicine to help prevent HIV infection. If you choose to take medicine to prevent HIV, you should first get tested for HIV.  You should then be tested every 3 months for as long as you are taking the medicine. Pregnancy  If you are about to stop having your period (premenopausal) and you may become pregnant, seek counseling before you get pregnant.  Take 400 to 800 micrograms (mcg) of folic acid every day if you become pregnant.  Ask for birth control (contraception) if you want to prevent pregnancy. Osteoporosis and menopause Osteoporosis is a disease in which the bones lose minerals and strength with aging. This can result in bone fractures. If you are 11 years old or older, or if you are at risk for osteoporosis and fractures, ask your health care provider if you should:  Be screened for bone loss.  Take a calcium or vitamin D supplement to lower your risk of fractures.  Be given hormone replacement therapy (HRT) to treat symptoms of menopause. Follow these instructions at home: Lifestyle  Do not use any products that contain nicotine or tobacco, such as cigarettes, e-cigarettes, and chewing tobacco. If you need help quitting, ask your  health care provider.  Do not use street drugs.  Do not share needles.  Ask your health care provider for help if you need support or information about quitting drugs. Alcohol use  Do not drink alcohol if: ? Your health care provider tells you not to drink. ? You are pregnant, may be pregnant, or are planning to become pregnant.  If you drink alcohol: ? Limit how much you use to 0-1 drink a day. ? Limit intake if you are breastfeeding.  Be aware of how much alcohol is in your drink. In the [place] one drink equals one 12 oz bottle of beer (355 mL), one 5 oz glass of wine (148 mL), or one 1 oz glass of hard liquor (44 mL). General instructions  Schedule regular health, dental, and eye exams.  Stay current with your vaccines.  Tell your health care provider if: ? You often feel depressed. ? You have ever been abused or do not feel safe at  home. Summary  Adopting a healthy lifestyle and getting preventive care are important in promoting health and wellness.  Follow your health care provider's instructions about healthy diet, exercising, and getting tested or screened for diseases.  Follow your health care provider's instructions on monitoring your cholesterol and blood pressure. This information is not intended to replace advice given to you by your health care provider. Make sure you discuss any questions you have with your health care provider. Document Revised: 05/14/2018 Document Reviewed: 05/14/2018 Elsevier Patient Education  2021 [employer].

## 2020-08-25 NOTE — Progress Notes (Signed)
50 y.o. [id number] Married female here for annual exam.    Feels anxiety, feels like interfering with daily life. Feels nervous.  Had Covid in January. Since then has not slept well and doesn't feel as calm. Nervousness and anxiety started right after diagnosed with Covid and has been getting worse over the last couple months. Feels like everything is bothering her. Not depressed, not suicidal. Not interested in therapy. Has good support in husband. Would be interested in medication for the short term.  Last child born in 2014. Used Nexplanon x 6 years, was removed 10/2019. Had normal periods from June through October.       Last normal period in October, had some bleeding in December.  Since Covid infection, has been bleeding everyday. (January through yesterday) Sometimes light and sometimes heavy.  Using withdrawal method for birth control. Does not want pregnancy.   Sexually active: Yes.    The current method of family planning is none.    Exercising: Yes.    walking Smoker:  no  Health Maintenance: Pap:  04-15-2018 neg HPV HR neg History of abnormal Pap:  no MMG:  none Colonoscopy:  none BMD:   none Gardasil:   n/a Covid-19: pfizer Hep C testing: neg 2022    reports that she has never smoked. She has never used smokeless tobacco. She reports that she does not drink alcohol and does not use drugs.  Past Medical History:  Diagnosis Date  . Anxiety     Past Surgical History:  Procedure Laterality Date  . DILATION AND EVACUATION N/A 05/08/2013   Procedure: DILATATION AND EVACUATION;  Surgeon: Antionette Char, MD;  Location: [facility];  Service: Gynecology;  Laterality: N/A;    Current Outpatient Medications  Medication Sig Dispense Refill  . cetirizine (ZYRTEC) 10 MG tablet Take 1 tablet (10 mg total) by mouth daily. 30 tablet 11  . hydrOXYzine (ATARAX/VISTARIL) 25 MG tablet Take 0.5-1 tablets (12.5-25 mg total) by mouth at bedtime. 60 tablet 2  . Vitamin D, Ergocalciferol,  (DRISDOL) 1.25 MG (50000 UNIT) CAPS capsule Take 1 capsule (50,000 Units total) by mouth every 7 (seven) days. 5 capsule 5   No current facility-administered medications for this visit.    Family History  Problem Relation Age of Onset  . Hypertension Mother   . Diabetes Maternal Grandmother   . Hypertension Maternal Grandmother     Review of Systems  Constitutional: Negative.   HENT: Negative.   Eyes: Negative.   Respiratory: Negative.   Cardiovascular: Negative.   Gastrointestinal: Negative.   Endocrine: Negative.   Genitourinary:       Irregular cycle  Musculoskeletal: Negative.   Skin: Negative.   Allergic/Immunologic: Negative.   Neurological: Negative.   Hematological: Negative.   Psychiatric/Behavioral: Negative.     Exam:   Ht 4' 10.75" (1.492 m)   Wt 138 lb (62.6 kg)   LMP  (Exact Date) Comment: bleeding since 06/2020  BMI 28.11 kg/m   Height: 4' 10.75" (149.2 cm)  General appearance: alert, cooperative and appears stated age, no acute distress Head: Normocephalic, without obvious abnormality Neck: no adenopathy, thyroid normal to inspection and palpation Lungs: clear to auscultation bilaterally Breasts: No axillary or supraclavicular adenopathy, Normal to palpation without dominant masses Heart: regular rate and rhythm Abdomen: soft, non-tender; no masses,  no organomegaly Extremities: extremities normal, no edema Skin: No rashes or lesions Lymph nodes: Cervical, supraclavicular, and axillary nodes normal. No abnormal inguinal nodes palpated Neurologic: Grossly normal   Pelvic: External genitalia:  no lesions              Urethra:  normal appearing urethra with no masses, tenderness or lesions              Bartholins and Skenes: normal                 Vagina: normal appearing vagina, appropriate for age, normal appearing discharge, no lesions              Cervix: neg cervical motion tenderness, no visible lesions             Bimanual Exam:   Uterus:   normal size, contour, position, consistency, mobility, non-tender              Adnexa: no mass, fullness, tenderness                 Joy, CMA Chaperone was present for exam.  A: Well woman exam  Menorrhagia with irregular cycle - Plan: TSH, CBC, Comp Met (CMET), Pregnancy, urine  OCP (oral contraceptive pills) initiation - Plan: levonorgestrel-ethinyl estradiol (VIENVA) 0.1-20 MG-MCG tablet  Anxiety - Plan: busPIRone (BUSPAR) 5 MG tablet  F/U 6 weeks to re-evaluate anxiety (and Buspar) and abnormal bleeding (pt states she might be interested in Depo Provera)   P:   Pap :  Mammogram:  Labs:  Medications:

## 2020-08-26 LAB — CBC
HCT: 41.4 % (ref 35.0–45.0)
Hemoglobin: 13.6 g/dL (ref 11.7–15.5)
MCH: 28.2 pg (ref 27.0–33.0)
MCHC: 32.9 g/dL (ref 32.0–36.0)
MCV: 85.9 fL (ref 80.0–100.0)
MPV: 10.8 fL (ref 7.5–12.5)
Platelets: 248 10*3/uL (ref 140–400)
RBC: 4.82 10*6/uL (ref 3.80–5.10)
RDW: 13.5 % (ref 11.0–15.0)
WBC: 4.4 10*3/uL (ref 3.8–10.8)

## 2020-08-26 LAB — COMPREHENSIVE METABOLIC PANEL
AG Ratio: 1.7 (calc) (ref 1.0–2.5)
ALT: 6 U/L (ref 6–29)
AST: 13 U/L (ref 10–35)
Albumin: 4.5 g/dL (ref 3.6–5.1)
Alkaline phosphatase (APISO): 37 U/L (ref 31–125)
BUN: 8 mg/dL (ref 7–25)
CO2: 26 mmol/L (ref 20–32)
Calcium: 9.6 mg/dL (ref 8.6–10.2)
Chloride: 103 mmol/L (ref 98–110)
Creat: 0.61 mg/dL (ref 0.50–1.10)
Globulin: 2.6 g/dL (calc) (ref 1.9–3.7)
Glucose, Bld: 84 mg/dL (ref 65–99)
Potassium: 4.3 mmol/L (ref 3.5–5.3)
Sodium: 138 mmol/L (ref 135–146)
Total Bilirubin: 0.3 mg/dL (ref 0.2–1.2)
Total Protein: 7.1 g/dL (ref 6.1–8.1)

## 2020-08-26 LAB — TSH: TSH: 1.39 mIU/L

## 2020-09-30 NOTE — Progress Notes (Deleted)
GYNECOLOGY  VISIT  CC:   ***  HPI: 50 y.o. [id number] Married Black or [place] American female here for follow up of anxiety & abnormal bleeding.     GYNECOLOGIC HISTORY: No LMP recorded. (Menstrual status: Irregular Periods). Contraception: *** Menopausal hormone therapy: ***  Patient Active Problem List   Diagnosis Date Noted  . Allergic conjunctivitis and rhinitis 11/05/2012  . Unspecified vitamin D deficiency 10/22/2012    Past Medical History:  Diagnosis Date  . Anxiety     Past Surgical History:  Procedure Laterality Date  . DILATION AND EVACUATION N/A 05/08/2013   Procedure: DILATATION AND EVACUATION;  Surgeon: Antionette Char, MD;  Location: [facility];  Service: Gynecology;  Laterality: N/A;    MEDS:   Current Outpatient Medications on File Prior to Visit  Medication Sig Dispense Refill  . busPIRone (BUSPAR) 5 MG tablet Take 1 tablet (5 mg total) by mouth 2 (two) times daily. 60 tablet 1  . cetirizine (ZYRTEC) 10 MG tablet Take 1 tablet (10 mg total) by mouth daily. 30 tablet 11  . hydrOXYzine (ATARAX/VISTARIL) 25 MG tablet Take 0.5-1 tablets (12.5-25 mg total) by mouth at bedtime. 60 tablet 2  . levonorgestrel-ethinyl estradiol (VIENVA) 0.1-20 MG-MCG tablet Take 1 tablet by mouth daily. 28 tablet 1  . Vitamin D, Ergocalciferol, (DRISDOL) 1.25 MG (50000 UNIT) CAPS capsule Take 1 capsule (50,000 Units total) by mouth every 7 (seven) days. 5 capsule 5   No current facility-administered medications on file prior to visit.    ALLERGIES: Patient has no known allergies.  Family History  Problem Relation Age of Onset  . Hypertension Mother   . Diabetes Maternal Grandmother   . Hypertension Maternal Grandmother      Review of Systems  PHYSICAL EXAMINATION:    There were no vitals taken for this visit.    General appearance: alert, cooperative, no acute distress  CV:  {Exam; heart brief:31539} Lungs:  {pe lungs ob:314451::"clear to auscultation, no wheezes,  rales or rhonchi, symmetric air entry"} Breasts: {Exam; breast:13139::"normal appearance, no masses or tenderness"} Abdomen: soft, non-tender; no masses,  no organomegaly Lymph:  no inguinal LAD noted  Pelvic: External genitalia:  no lesions              Urethra:  normal appearing urethra with no masses, tenderness or lesions              Bartholins and Skenes: normal                 Vagina: normal appearing vagina               Cervix: {CHL AMB PHY EX CERVIX NORM [phone]::"no lesions"}              Bimanual Exam:  Uterus:  {CHL AMB PHY EX UTERUS NORM DEFAULT:[phone]::"normal size, contour, position, consistency, mobility, non-tender"}              Adnexa: {CHL AMB PHY EX ADNEXA NO MASS [phone]::"no mass, fullness, tenderness"}               Chaperone, ***, CMA, was present for exam.  Assessment: ***  Plan: ***   {NUMBERS; -10-45 JOINT ROM:10287} minutes of total time was spent for this patient encounter, including preparation, face-to-face counseling with the patient and coordination of care, and documentation of the encounter.

## 2020-10-03 ENCOUNTER — Ambulatory Visit: Payer: 59 | Admitting: Nurse Practitioner

## 2020-10-03 NOTE — Progress Notes (Deleted)
GYNECOLOGY  VISIT  CC:   ***  HPI: 50 y.o. [id number] Married Black or African American female here for follow up of anxiety & abnormal bleeding.     GYNECOLOGIC HISTORY: No LMP recorded. (Menstrual status: Irregular Periods). Contraception: *** Menopausal hormone therapy: ***  Patient Active Problem List   Diagnosis Date Noted  . Allergic conjunctivitis and rhinitis 11/05/2012  . Unspecified vitamin D deficiency 10/22/2012    Past Medical History:  Diagnosis Date  . Anxiety     Past Surgical History:  Procedure Laterality Date  . DILATION AND EVACUATION N/A 05/08/2013   Procedure: DILATATION AND EVACUATION;  Surgeon: Lisa Jackson-Moore, MD;  Location: [facility];  Service: Gynecology;  Laterality: N/A;    MEDS:   Current Outpatient Medications on File Prior to Visit  Medication Sig Dispense Refill  . busPIRone (BUSPAR) 5 MG tablet Take 1 tablet (5 mg total) by mouth 2 (two) times daily. 60 tablet 1  . cetirizine (ZYRTEC) 10 MG tablet Take 1 tablet (10 mg total) by mouth daily. 30 tablet 11  . hydrOXYzine (ATARAX/VISTARIL) 25 MG tablet Take 0.5-1 tablets (12.5-25 mg total) by mouth at bedtime. 60 tablet 2  . levonorgestrel-ethinyl estradiol (VIENVA) 0.1-20 MG-MCG tablet Take 1 tablet by mouth daily. 28 tablet 1  . Vitamin D, Ergocalciferol, (DRISDOL) 1.25 MG (50000 UNIT) CAPS capsule Take 1 capsule (50,000 Units total) by mouth every 7 (seven) days. 5 capsule 5   No current facility-administered medications on file prior to visit.    ALLERGIES: Patient has no known allergies.  Family History  Problem Relation Age of Onset  . Hypertension Mother   . Diabetes Maternal Grandmother   . Hypertension Maternal Grandmother      Review of Systems  PHYSICAL EXAMINATION:    There were no vitals taken for this visit.    General appearance: alert, cooperative, no acute distress  CV:  {Exam; heart brief:31539} Lungs:  {pe lungs ob:314451::"clear to auscultation, no wheezes,  rales or rhonchi, symmetric air entry"} Breasts: {Exam; breast:13139::"normal appearance, no masses or tenderness"} Abdomen: soft, non-tender; no masses,  no organomegaly Lymph:  no inguinal LAD noted  Pelvic: External genitalia:  no lesions              Urethra:  normal appearing urethra with no masses, tenderness or lesions              Bartholins and Skenes: normal                 Vagina: normal appearing vagina               Cervix: {CHL AMB PHY EX CERVIX NORM DEFAULT:[id number]::"no lesions"}              Bimanual Exam:  Uterus:  {CHL AMB PHY EX UTERUS NORM DEFAULT:[id number]::"normal size, contour, position, consistency, mobility, non-tender"}              Adnexa: {CHL AMB PHY EX ADNEXA NO MASS DEFAULT:[id number]::"no mass, fullness, tenderness"}               Chaperone, ***, CMA, was present for exam.  Assessment: ***  Plan: ***   {NUMBERS; -10-45 JOINT ROM:10287} minutes of total time was spent for this patient encounter, including preparation, face-to-face counseling with the patient and coordination of care, and documentation of the encounter.     

## 2020-10-06 ENCOUNTER — Ambulatory Visit: Payer: 59 | Admitting: Nurse Practitioner

## 2020-10-11 ENCOUNTER — Encounter: Payer: Self-pay | Admitting: Nurse Practitioner

## 2020-10-11 ENCOUNTER — Ambulatory Visit: Payer: 59 | Admitting: Nurse Practitioner

## 2020-10-11 ENCOUNTER — Other Ambulatory Visit: Payer: Self-pay

## 2020-10-11 VITALS — BP 118/72

## 2020-10-11 DIAGNOSIS — F419 Anxiety disorder, unspecified: Secondary | ICD-10-CM | POA: Diagnosis not present

## 2020-10-11 DIAGNOSIS — Z3041 Encounter for surveillance of contraceptive pills: Secondary | ICD-10-CM | POA: Diagnosis not present

## 2020-10-11 MED ORDER — LEVONORGESTREL-ETHINYL ESTRAD 0.1-20 MG-MCG PO TABS: 1.0000 | ORAL_TABLET | Freq: Every day | ORAL | 3 refills | Status: DC

## 2020-10-11 NOTE — Progress Notes (Signed)
GYNECOLOGY  VISIT  CC: Follow up check for vag. bleeding and anxiety--doing better  HPI: 50 y.o. [id number] Married Black or [place] American female here for follow up Started Buspar and Vienva on 08/25/2020  Started [place] and almost done. Denies any spotting. Period came when expected and did not have any bleeding in between. Would like to continue. Denies side effects except heart burn when takes it at night. Will take in morning.  Tried Buspar only 2 days, stopped because did not like how she felt. Feels like Anxiety is better, maybe it was because of recent Covid in January. Relationship is good and feels husband is supportive.    Period Duration (Days): 7 Period Pattern: (!) Irregular Menstrual Flow: Heavy Dysmenorrhea: None  GYNECOLOGIC HISTORY: Patient's last menstrual period was 09/20/2020. ContraceptionNone Menopausal hormone therapy:N/A  Patient Active Problem List   Diagnosis Date Noted  . Allergic conjunctivitis and rhinitis 11/05/2012  . Unspecified vitamin D deficiency 10/22/2012    Past Medical History:  Diagnosis Date  . Anxiety     Past Surgical History:  Procedure Laterality Date  . DILATION AND EVACUATION N/A 05/08/2013   Procedure: DILATATION AND EVACUATION;  Surgeon: Antionette Char, MD;  Location: [facility];  Service: Gynecology;  Laterality: N/A;    MEDS:   Current Outpatient Medications on File Prior to Visit  Medication Sig Dispense Refill  . levonorgestrel-ethinyl estradiol (VIENVA) 0.1-20 MG-MCG tablet Take 1 tablet by mouth daily. 28 tablet 1  . raNITIdine HCl (ZANTAC PO) Take by mouth.    . Vitamin D, Ergocalciferol, (DRISDOL) 1.25 MG (50000 UNIT) CAPS capsule Take 1 capsule (50,000 Units total) by mouth every 7 (seven) days. 5 capsule 5  . busPIRone (BUSPAR) 5 MG tablet Take 1 tablet (5 mg total) by mouth 2 (two) times daily. (Patient not taking: Reported on 10/11/2020) 60 tablet 1  . cetirizine (ZYRTEC) 10 MG tablet Take 1 tablet (10 mg total)  by mouth daily. (Patient not taking: Reported on 10/11/2020) 30 tablet 11  . hydrOXYzine (ATARAX/VISTARIL) 25 MG tablet Take 0.5-1 tablets (12.5-25 mg total) by mouth at bedtime. (Patient not taking: Reported on 10/11/2020) 60 tablet 2   No current facility-administered medications on file prior to visit.    ALLERGIES: Patient has no known allergies.  Family History  Problem Relation Age of Onset  . Hypertension Mother   . Diabetes Maternal Grandmother   . Hypertension Maternal Grandmother      Review of Systems  PHYSICAL EXAMINATION:    BP 118/72   LMP 09/20/2020     General appearance: alert, cooperative, no acute distress Heart: regular rate and rhythm, no murmur Lungs: CTA     Assessment/Plan: Surveillance for birth control, oral contraceptives - Plan: levonorgestrel-ethinyl estradiol (VIENVA) 0.1-20 MG-MCG tablet  Anxiety- reports doing better, no treatment needed  15 min spent reviewing last note, history taking, examining, discussing plan  F/U 08/2021 for annual exam

## 2021-08-29 ENCOUNTER — Encounter: Payer: Self-pay | Admitting: Radiology

## 2021-08-29 ENCOUNTER — Other Ambulatory Visit (HOSPITAL_COMMUNITY)
Admission: RE | Admit: 2021-08-29 | Discharge: 2021-08-29 | Disposition: A | Discharge status: Home / Self Care | Payer: 59 | Source: Ambulatory Visit | Attending: Radiology | Admitting: Radiology

## 2021-08-29 ENCOUNTER — Ambulatory Visit (INDEPENDENT_AMBULATORY_CARE_PROVIDER_SITE_OTHER): Payer: PRIVATE HEALTH INSURANCE | Admitting: Radiology

## 2021-08-29 ENCOUNTER — Other Ambulatory Visit: Payer: Self-pay

## 2021-08-29 ENCOUNTER — Telehealth: Payer: Self-pay | Admitting: *Deleted

## 2021-08-29 VITALS — BP 114/70 | Ht 58.25 in | Wt 153.0 lb

## 2021-08-29 DIAGNOSIS — Z01419 Encounter for gynecological examination (general) (routine) without abnormal findings: Secondary | ICD-10-CM | POA: Diagnosis present

## 2021-08-29 DIAGNOSIS — Z1211 Encounter for screening for malignant neoplasm of colon: Secondary | ICD-10-CM

## 2021-08-29 DIAGNOSIS — Z3041 Encounter for surveillance of contraceptive pills: Secondary | ICD-10-CM | POA: Diagnosis not present

## 2021-08-29 MED ORDER — LEVONORGESTREL-ETHINYL ESTRAD 0.1-20 MG-MCG PO TABS: 1.0000 | ORAL_TABLET | Freq: Every day | ORAL | 4 refills | Status: DC

## 2021-08-29 NOTE — Telephone Encounter (Signed)
-----   Message from Tanda Rockers, NP sent at 08/29/2021 11:09 AM EDT ----- ?Regarding: GI referral ?GI referral- colonoscopy screening ? ?

## 2021-08-29 NOTE — Telephone Encounter (Signed)
Referral placed at [facility] they will call patient to schedule.  

## 2021-08-29 NOTE — Progress Notes (Signed)
? ?[name] 1970-09-07 [id number] ? ? ?History:  51 y.o. G8P8 presents for annual exam. ? ?Gynecologic History ?Patient's last menstrual period was 08/05/2021 (approximate). ?Period Cycle (Days): 28 ?Period Duration (Days): 3 ?Period Pattern: Regular ?Menstrual Flow: Heavy ?Dysmenorrhea: None ?Contraception/Family planning: OCP (estrogen/progesterone) ?Sexually active: yes ?Last Pap: 2019. Results were: normal ?Last mammogram: never ? ?Obstetric History ?OB History  ?Gravida Para Term Preterm AB Living  ?8 8 8     8   ?SAB IAB Ectopic Multiple Live Births  ?        8  ?  ?# Outcome Date GA Lbr Len/2nd Weight Sex Delivery Anes PTL Lv  ?8 Term 05/08/13 [redacted]w[redacted]d 12:44 / 00:13 7 lb 12.9 oz (3.54 kg) F VBAC EPI  LIV  ?7 Term 02/20/07 [redacted]w[redacted]d  7 lb 14 oz (3.572 kg) M VBAC EPI  LIV  ?   Birth Comments: No complications  ?6 Term 01/18/03 [redacted]w[redacted]d  6 lb 6 oz (2.892 kg) F CS-LTranv   LIV  ?   Birth Comments: No complications - breech  ?5 Term 12/30/97 [redacted]w[redacted]d  8 lb (3.629 kg) M Vag-Spont None  LIV  ?   Birth Comments: No complications  ?4 Term 12/11/96 [redacted]w[redacted]d  7 lb 15 oz (3.6 kg) F Vag-Spont None  LIV  ?   Birth Comments: No complications  ?3 Term 08/10/95 [redacted]w[redacted]d  7 lb 6 oz (3.345 kg) F Vag-Spont None  LIV  ?   Birth Comments: No complications  ?2 Term 11/25/93 [redacted]w[redacted]d  7 lb (3.175 kg) F Vag-Spont None  LIV  ?   Birth Comments: No complications  ?1 Term 06/05/90 [redacted]w[redacted]d  7 lb (3.175 kg) M Vag-Spont None  LIV  ?   Birth Comments: No comp;ications  ? ? ? ?The following portions of the patient's history were reviewed and updated as appropriate: allergies, current medications, past family history, past medical history, past social history, past surgical history, and problem list. ? ?Review of Systems ?Pertinent items noted in HPI and remainder of comprehensive ROS otherwise negative.  ? ?Past medical history, past surgical history, family history and social history were all reviewed and documented in the EPIC chart. ? ? ?Exam: ? ?Vitals:  ?  08/29/21 1021  ?BP: 114/70  ?Weight: 153 lb (69.4 kg)  ?Height: 4' 10.25" (1.48 m)  ? ?Body mass index is 31.7 kg/m?. ? ?General appearance:  Normal ?Thyroid:  Symmetrical, normal in size, without palpable masses or nodularity. ?Respiratory ? Auscultation:  Clear without wheezing or rhonchi ?Cardiovascular ? Auscultation:  Regular rate, without rubs, murmurs or gallops ? Edema/varicosities:  Not grossly evident ?Abdominal ? Soft,nontender, without masses, guarding or rebound. ? Liver/spleen:  No organomegaly noted ? Hernia:  None appreciated ? Skin ? Inspection:  Grossly normal ?Breasts: Examined lying and sitting.  ? Right: Without masses, retractions, nipple discharge or axillary adenopathy. ? ? Left: Without masses, retractions, nipple discharge or axillary adenopathy. ?Genitourinary  ? Inguinal/mons:  Normal without inguinal adenopathy ? External genitalia:  Normal appearing vulva with no masses, tenderness, or lesions ? BUS/Urethra/Skene's glands:  Normal without masses or exudate ? Vagina:  Normal appearing with normal color and discharge, no lesions ? Cervix:  Normal appearing without discharge or lesions, pap obtained ? Uterus:  Normal in size, shape and contour.  Mobile, nontender ? Adnexa/parametria:   ?  Rt: Normal in size, without masses or tenderness. ?  Lt: Normal in size, without masses or tenderness. ? Anus and perineum: Normal ?  ?Patient  informed chaperone available to be present for breast and pelvic exam. Patient has requested no chaperone to be present. Patient has been advised what will be completed during breast and pelvic exam.  ? ?Assessment/Plan:   ?1. Well female exam with routine gynecological exam ?Pap with cotesting ?Schedule mammo  ?GI referral ?- Cytology - PAP( [facility]) ? ?2. Surveillance for birth control, oral contraceptives ?Continue OCPs ?- levonorgestrel-ethinyl estradiol (VIENVA) 0.1-20 MG-MCG tablet; Take 1 tablet by mouth daily.  Dispense: 84 tablet; Refill:  4 ? ? ? ?Discussed SBE, colonoscopy and DEXA screening as directed/appropriate. Recommend of exercise weekly, including weight bearing exercise. Encouraged the use of seatbelts and sunscreen. ?Return in 1 year for annual or as needed.  ? ?Tanda Rockers WHNP-BC 10:58 AM 08/29/2021  ?

## 2021-09-01 LAB — CYTOLOGY - PAP
Comment: NEGATIVE
Diagnosis: NEGATIVE
High risk HPV: NEGATIVE

## 2021-09-27 NOTE — Telephone Encounter (Signed)
[facility] called x1 left message for patient to call. ?

## 2021-10-24 NOTE — Telephone Encounter (Signed)
Referral closed by GI office patient never called back to schedule.

## 2022-08-31 ENCOUNTER — Ambulatory Visit: Payer: PRIVATE HEALTH INSURANCE | Admitting: Radiology

## 2022-09-04 ENCOUNTER — Ambulatory Visit: Payer: PRIVATE HEALTH INSURANCE | Admitting: Radiology

## 2022-09-30 ENCOUNTER — Other Ambulatory Visit: Payer: Self-pay | Admitting: Radiology

## 2022-09-30 DIAGNOSIS — Z3041 Encounter for surveillance of contraceptive pills: Secondary | ICD-10-CM

## 2022-10-25 ENCOUNTER — Encounter: Payer: Self-pay | Admitting: Student

## 2022-10-25 ENCOUNTER — Ambulatory Visit: Payer: BLUE CROSS/BLUE SHIELD | Admitting: Student

## 2022-10-25 VITALS — BP 135/83 | HR 68 | Ht <= 58 in | Wt 153.4 lb

## 2022-10-25 DIAGNOSIS — J309 Allergic rhinitis, unspecified: Secondary | ICD-10-CM | POA: Diagnosis not present

## 2022-10-25 DIAGNOSIS — Z3041 Encounter for surveillance of contraceptive pills: Secondary | ICD-10-CM | POA: Diagnosis not present

## 2022-10-25 DIAGNOSIS — Z1211 Encounter for screening for malignant neoplasm of colon: Secondary | ICD-10-CM

## 2022-10-25 DIAGNOSIS — M25511 Pain in right shoulder: Secondary | ICD-10-CM

## 2022-10-25 DIAGNOSIS — Z1231 Encounter for screening mammogram for malignant neoplasm of breast: Secondary | ICD-10-CM

## 2022-10-25 DIAGNOSIS — Z13228 Encounter for screening for other metabolic disorders: Secondary | ICD-10-CM | POA: Diagnosis not present

## 2022-10-25 LAB — POCT GLYCOSYLATED HEMOGLOBIN (HGB A1C): Hemoglobin A1C: 5.4 % (ref 4.0–5.6)

## 2022-10-25 MED ORDER — CETIRIZINE HCL 10 MG PO TABS
10.0000 mg | ORAL_TABLET | Freq: Every day | ORAL | 11 refills | Status: AC
Start: 2022-10-25 — End: ?

## 2022-10-25 MED ORDER — LEVONORGESTREL-ETHINYL ESTRAD 0.1-20 MG-MCG PO TABS
1.0000 | ORAL_TABLET | Freq: Every day | ORAL | 4 refills | Status: AC
Start: 2022-10-25 — End: ?

## 2022-10-25 NOTE — Progress Notes (Signed)
New Patient Office Visit  Subjective    Patient ID: [name], female    DOB: [birth year]  Age: 52 y.o. MRN: [id number]  CC:  Chief Complaint  Patient presents with   Establish Care    HPI [name] presents to establish care PMH- hx of asymptomatic microscopic hematuria-pelvic and renal u/s normal-referred to urology but never went Meds- cetirizine, vienva,  Surgeries- C-section, dilation and evacuation 2014 for retained placenta Allergies- NKA Social- no alcohol, tobacco use, drug use FH- no heart attack, cancers or strokes in family; history tab updated  Right shoulder pain-has been ongoing for 4 months. Not able to lay on right side at night due to pain. Taking ibuprofen PRN. Said she was pulling something 4 months ago and has noticed pain ever since.  Outpatient Encounter Medications as of 10/25/2022  Medication Sig   cetirizine (ZYRTEC) 10 MG tablet Take 1 tablet (10 mg total) by mouth daily.   levonorgestrel-ethinyl estradiol (VIENVA) 0.1-20 MG-MCG tablet Take 1 tablet by mouth daily.   [DISCONTINUED] cetirizine (ZYRTEC) 10 MG tablet Take 1 tablet (10 mg total) by mouth daily.   [DISCONTINUED] hydrOXYzine (ATARAX/VISTARIL) 25 MG tablet Take 0.5-1 tablets (12.5-25 mg total) by mouth at bedtime. (Patient not taking: Reported on 10/11/2020)   [DISCONTINUED] levonorgestrel-ethinyl estradiol (VIENVA) 0.1-20 MG-MCG tablet Take 1 tablet by mouth daily.   [DISCONTINUED] raNITIdine HCl (ZANTAC PO) Take by mouth. (Patient not taking: Reported on 08/29/2021)   [DISCONTINUED] Vitamin D, Ergocalciferol, (DRISDOL) 1.25 MG (50000 UNIT) CAPS capsule Take 1 capsule (50,000 Units total) by mouth every 7 (seven) days.   No facility-administered encounter medications on file as of 10/25/2022.    Past Medical History:  Diagnosis Date   Anxiety     Past Surgical History:  Procedure Laterality Date   CESAREAN SECTION     DILATION AND EVACUATION N/A 05/08/2013   Procedure: DILATATION  AND EVACUATION;  Surgeon: Antionette Char, MD;  Location: [facility];  Service: Gynecology;  Laterality: N/A;    Family History  Problem Relation Age of Onset   Hypertension Mother    Asthma Father    Diabetes Maternal Grandmother    Hypertension Maternal Grandmother    Heart failure Other     Social History   Socioeconomic History   Marital status: Married    Spouse name: Not on file   Number of children: 8   Years of education: Not on file   Highest education level: Not on file  Occupational History   Not on file  Tobacco Use   Smoking status: Never   Smokeless tobacco: Never  Vaping Use   Vaping Use: Never used  Substance and Sexual Activity   Alcohol use: No   Drug use: No   Sexual activity: Yes    Partners: Male    Birth control/protection: Pill  Other Topics Concern   Not on file  Social History Narrative   She is from [place], [place]. 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   Social Determinants of Health   Financial Resource Strain: Low Risk  (04/15/2018)   Overall Financial Resource Strain (CARDIA)    Difficulty of Paying Living Expenses: Not hard at all  Food Insecurity: No Food Insecurity (04/15/2018)   Hunger Vital Sign    Worried About Running Out of Food in the Last Year: Never true    Ran Out of Food in the Last Year: Never true  Transportation Needs: No Transportation Needs (04/15/2018)  PRAPARE - Administrator, Civil Service (Medical): No    Lack of Transportation (Non-Medical): No  Physical Activity: Inactive (04/15/2018)   Exercise Vital Sign    Days of Exercise per Week: 0 days    Minutes of Exercise per Session: 0 min  Stress: No Stress Concern Present (04/15/2018)   [employer] of Occupational Health - Occupational Stress Questionnaire    Feeling of Stress : Not at all  Social Connections: Socially Integrated (04/15/2018)   Social Connection and Isolation Panel [NHANES]    Frequency of Communication with  Friends and Family: More than three times a week    Frequency of Social Gatherings with Friends and Family: More than three times a week    Attends Religious Services: More than 4 times per year    Active Member of [employer] or Organizations: Yes    Attends Engineer, structural: More than 4 times per year    Marital Status: Married  Catering manager Violence: Not At Risk (04/15/2018)   Humiliation, Afraid, Rape, and Kick questionnaire    Fear of Current or Ex-Partner: No    Emotionally Abused: No    Physically Abused: No    Sexually Abused: No    Objective    BP 135/83   Pulse 68   Ht 4\' 10"  (1.473 m)   Wt 153 lb 6.4 oz (69.6 kg)   LMP 09/30/2022   SpO2 100%   BMI 32.06 kg/m   General: Well appearing, NAD, awake, alert, responsive to questions Head: Normocephalic atraumatic CV: Regular rate and rhythm no murmurs rubs or gallops Respiratory: Clear to ausculation bilaterally, no wheezes rales or crackles, chest rises symmetrically,  no increased work of breathing Neuro: No focal deficits Skin: No rashes or lesions visualized Shoulder: Inspection reveals no obvious deformity, atrophy, or asymmetry b/l. No bruising. No swelling Palpation is  TTP over AC joint Full ROM in flexion, abduction, internal/external rotation b/l NV intact distally b/l Special Tests:  - Impingement: Neg Hawkins, empty can sign. - Supraspinatous: Negative empty can - Infraspinatous/Teres Minor: 5/5 strength with ER - Subscapularis: 5/5 strength with IR - No painful arc and no drop arm sign   Assessment & Plan:   Problem List Items Addressed This Visit       Other   Acute pain of right shoulder - Primary    Right shoulder pain with tender palpation over AC joint. Will obtain imaging to see if any osteoarthritis present. Unlikely to be rotator cuff injury given negative special tests. Could consider sports medicine referral for injections pending imaging/continued pain. -Complete R shoulder       Relevant Orders   DG Shoulder Right   Other Visit Diagnoses     Surveillance for birth control, oral contraceptives       Relevant Medications   levonorgestrel-ethinyl estradiol (VIENVA) 0.1-20 MG-MCG tablet   Allergic rhinitis, unspecified seasonality, unspecified trigger       Relevant Medications   cetirizine (ZYRTEC) 10 MG tablet   Encounter for screening for other metabolic disorders       Relevant Orders   CBC   Basic Metabolic Panel   HgB A1c (Completed)   Lipid Panel   Screening for colon cancer       Relevant Orders   Ambulatory referral to Gastroenterology   Encounter for screening mammogram for malignant neoplasm of breast       Relevant Orders   MM 3D SCREENING MAMMOGRAM BILATERAL BREAST  Levin Erp, MD

## 2022-10-25 NOTE — Assessment & Plan Note (Signed)
Right shoulder pain with tender palpation over AC joint. Will obtain imaging to see if any osteoarthritis present. Unlikely to be rotator cuff injury given negative special tests. Could consider sports medicine referral for injections pending imaging/continued pain. -Complete R shoulder

## 2022-10-25 NOTE — Patient Instructions (Addendum)
It was great to see you! Thank you for allowing me to participate in your care!   I recommend that you always bring your medications to each appointment as this makes it easy to ensure we are on the correct medications and helps [place] not miss when refills are needed.  Our plans for today:  -I am going to get a right shoulder x-ray to see what is going on in your shoulder-you can go to [facility] radiology or [facility] imaging to get this done -I have ordered a colonoscopy and mammogram for you-please call to set up your mammogram -I refilled your medications  We are checking some labs today, I will call you if they are abnormal will send you a MyChart message or a letter if they are normal.  If you do not hear about your labs in the next 2 weeks please let us know.  Take care and seek immediate care sooner if you develop any concerns. Please remember to show up 15 minutes before your scheduled appointment time!  Levin Erp, MD [facility]

## 2022-10-26 LAB — CBC
Hematocrit: 42 % (ref 34.0–46.6)
Hemoglobin: 13.7 g/dL (ref 11.1–15.9)
MCH: 26.9 pg (ref 26.6–33.0)
MCHC: 32.6 g/dL (ref 31.5–35.7)
MCV: 83 fL (ref 79–97)
Platelets: 273 10*3/uL (ref 150–450)
RBC: 5.09 x10E6/uL (ref 3.77–5.28)
RDW: 13.2 % (ref 11.7–15.4)
WBC: 4.9 10*3/uL (ref 3.4–10.8)

## 2022-10-26 LAB — BASIC METABOLIC PANEL
BUN/Creatinine Ratio: 16 (ref 9–23)
BUN: 10 mg/dL (ref 6–24)
CO2: 20 mmol/L (ref 20–29)
Calcium: 9.3 mg/dL (ref 8.7–10.2)
Chloride: 104 mmol/L (ref 96–106)
Creatinine, Ser: 0.63 mg/dL (ref 0.57–1.00)
Glucose: 84 mg/dL (ref 70–99)
Potassium: 4.3 mmol/L (ref 3.5–5.2)
Sodium: 139 mmol/L (ref 134–144)
eGFR: 108 mL/min/{1.73_m2} (ref >59)

## 2022-11-21 ENCOUNTER — Ambulatory Visit: Payer: BLUE CROSS/BLUE SHIELD

## 2023-11-12 ENCOUNTER — Encounter: Payer: Self-pay | Admitting: *Deleted
